# Patient Record
Sex: Female | Born: 1950 | Race: White | Hispanic: No | Marital: Married | Smoking: Never smoker
Health system: Southern US, Community
[De-identification: ages and names within clinical notes are randomized; demographics above are authoritative.]

## PROBLEM LIST (undated history)

## (undated) DIAGNOSIS — K449 Diaphragmatic hernia without obstruction or gangrene: Secondary | ICD-10-CM

## (undated) DIAGNOSIS — Z87820 Personal history of traumatic brain injury: Secondary | ICD-10-CM

## (undated) DIAGNOSIS — K219 Gastro-esophageal reflux disease without esophagitis: Secondary | ICD-10-CM

## (undated) DIAGNOSIS — K603 Anal fistula, unspecified: Secondary | ICD-10-CM

## (undated) DIAGNOSIS — M810 Age-related osteoporosis without current pathological fracture: Secondary | ICD-10-CM

## (undated) HISTORY — PX: TUBAL LIGATION: SHX77

## (undated) HISTORY — DX: Diaphragmatic hernia without obstruction or gangrene: K44.9

## (undated) HISTORY — PX: UPPER GASTROINTESTINAL ENDOSCOPY: SHX188

## (undated) HISTORY — PX: TONSILLECTOMY: SUR1361

## (undated) HISTORY — DX: Age-related osteoporosis without current pathological fracture: M81.0

---

## 1988-06-24 HISTORY — PX: TOTAL ABDOMINAL HYSTERECTOMY W/ BILATERAL SALPINGOOPHORECTOMY: SHX83

## 1993-06-24 HISTORY — PX: OTHER SURGICAL HISTORY: SHX169

## 1997-10-06 ENCOUNTER — Ambulatory Visit (HOSPITAL_COMMUNITY): Admission: RE | Admit: 1997-10-06 | Discharge: 1997-10-06 | Payer: Self-pay | Admitting: Obstetrics and Gynecology

## 2000-02-15 ENCOUNTER — Other Ambulatory Visit: Admission: RE | Admit: 2000-02-15 | Discharge: 2000-02-15 | Payer: Self-pay | Admitting: Obstetrics & Gynecology

## 2001-03-06 ENCOUNTER — Other Ambulatory Visit: Admission: RE | Admit: 2001-03-06 | Discharge: 2001-03-06 | Payer: Self-pay | Admitting: Obstetrics & Gynecology

## 2002-01-15 ENCOUNTER — Ambulatory Visit (HOSPITAL_COMMUNITY): Admission: RE | Admit: 2002-01-15 | Discharge: 2002-01-15 | Payer: Self-pay | Admitting: Gastroenterology

## 2002-01-15 ENCOUNTER — Encounter: Payer: Self-pay | Admitting: Gastroenterology

## 2002-05-14 ENCOUNTER — Other Ambulatory Visit: Admission: RE | Admit: 2002-05-14 | Discharge: 2002-05-14 | Payer: Self-pay | Admitting: Obstetrics & Gynecology

## 2002-05-19 ENCOUNTER — Encounter: Payer: Self-pay | Admitting: General Surgery

## 2002-05-19 ENCOUNTER — Encounter: Payer: Self-pay | Admitting: Emergency Medicine

## 2002-05-19 ENCOUNTER — Encounter: Payer: Self-pay | Admitting: Family Medicine

## 2002-05-19 ENCOUNTER — Encounter: Admission: RE | Admit: 2002-05-19 | Discharge: 2002-05-19 | Payer: Self-pay | Admitting: Family Medicine

## 2002-05-19 ENCOUNTER — Encounter (INDEPENDENT_AMBULATORY_CARE_PROVIDER_SITE_OTHER): Payer: Self-pay | Admitting: Specialist

## 2002-05-19 ENCOUNTER — Observation Stay (HOSPITAL_COMMUNITY): Admission: EM | Admit: 2002-05-19 | Discharge: 2002-05-20 | Payer: Self-pay | Admitting: Emergency Medicine

## 2002-05-19 HISTORY — PX: LAPAROSCOPIC CHOLECYSTECTOMY: SUR755

## 2002-09-18 ENCOUNTER — Encounter: Payer: Self-pay | Admitting: Gastroenterology

## 2004-04-04 ENCOUNTER — Other Ambulatory Visit: Admission: RE | Admit: 2004-04-04 | Discharge: 2004-04-04 | Payer: Self-pay | Admitting: Obstetrics & Gynecology

## 2007-02-20 ENCOUNTER — Ambulatory Visit: Payer: Self-pay | Admitting: Gastroenterology

## 2007-04-10 ENCOUNTER — Ambulatory Visit: Payer: Self-pay | Admitting: Gastroenterology

## 2007-04-10 HISTORY — PX: COLONOSCOPY: SHX174

## 2007-09-18 DIAGNOSIS — J309 Allergic rhinitis, unspecified: Secondary | ICD-10-CM | POA: Insufficient documentation

## 2007-09-18 DIAGNOSIS — Z8719 Personal history of other diseases of the digestive system: Secondary | ICD-10-CM | POA: Insufficient documentation

## 2007-09-18 DIAGNOSIS — K449 Diaphragmatic hernia without obstruction or gangrene: Secondary | ICD-10-CM | POA: Insufficient documentation

## 2007-09-18 DIAGNOSIS — K625 Hemorrhage of anus and rectum: Secondary | ICD-10-CM

## 2007-09-18 DIAGNOSIS — I499 Cardiac arrhythmia, unspecified: Secondary | ICD-10-CM | POA: Insufficient documentation

## 2007-09-18 DIAGNOSIS — K222 Esophageal obstruction: Secondary | ICD-10-CM

## 2007-09-18 DIAGNOSIS — K649 Unspecified hemorrhoids: Secondary | ICD-10-CM | POA: Insufficient documentation

## 2008-01-19 ENCOUNTER — Ambulatory Visit (HOSPITAL_BASED_OUTPATIENT_CLINIC_OR_DEPARTMENT_OTHER): Admission: RE | Admit: 2008-01-19 | Discharge: 2008-01-19 | Payer: Self-pay | Admitting: Orthopedic Surgery

## 2008-01-19 HISTORY — PX: CARPAL TUNNEL RELEASE: SHX101

## 2008-08-26 ENCOUNTER — Encounter: Admission: RE | Admit: 2008-08-26 | Discharge: 2008-08-26 | Payer: Self-pay | Admitting: Obstetrics and Gynecology

## 2010-05-15 ENCOUNTER — Encounter: Admission: RE | Admit: 2010-05-15 | Discharge: 2010-05-15 | Payer: Self-pay | Admitting: "Endocrinology

## 2010-11-06 NOTE — Op Note (Signed)
NAMESHARNAE, Tamara Colon                    ACCOUNT NO.:  [id number]   MEDICAL RECORD NO.:  [id number]          PATIENT TYPE:  AMB   LOCATION:  DSC                          FACILITY:  [facility]   PHYSICIAN:  Cindee Salt, M.D.       DATE OF BIRTH:  12-29-1950   DATE OF PROCEDURE:  DATE OF DISCHARGE:                               OPERATIVE REPORT   PREOPERATIVE DIAGNOSIS:  Carpal tunnel syndrome, right hand.   POSTOPERATIVE DIAGNOSIS:  Carpal tunnel syndrome, right hand.   OPERATION:  Decompression right median nerve.   SURGEON:  Cindee Salt, MD   ASSISTANT:  Ivery Quale, P.A.   ANESTHESIA:  Forearm based IV regional.   ANESTHESIOLOGIST:  W. Autumn Patty, MD.   HISTORY:  The patient is a 60 year old female with a history of carpal  tunnel syndrome.  EMG nerve conduction is positive.  This has not  responded to conservative treatment.  She has elected to undergo  surgical decompression.  Pre, peri, and postoperative course have been  discussed along with the risks and complications.  She is aware that  there is no guarantee with the surgery; possibility of infection,  recurrence; injury to arteries nerves, or tendons; complete relief of  symptoms; and dystrophy.  In the preoperative area, the patient is seen.  The extremity marked by both the patient and surgeon.  Antibiotic given.   PROCEDURE:  The patient was brought to the operating room where forearm  based IV regional anesthetic was carried out under the direction of Dr.  Sampson Goon.  She was prepped using DuraPrep in the supine position with  the right arm free.  A time-out was taken.  Following this, a  longitudinal incision was made in the palm and carried down through the  subcutaneous tissue.  Bleeders were electrocauterized, palmar fascia was  split, superficial palmar arch identified, and the flexor tendon to the  ring little finger identified to the ulnar side of the median nerve.  Carpal retinaculum was incised with sharp  dissection.  Right angle and  Sewall retractor were placed between the skin and the forearm fascia.  The fascia was released for approximately 1.5 centimeter proximal to the  wrist crease under direct vision.  Canal was explored.  No further  lesions were identified.  The wound was irrigated.  Skin closed with  interrupted 5-0 Vicryl Rapide sutures.  The area of compression to the  nerve was apparent with persistent median  artery, which was not thrombosed.  The patient tolerated the procedure  well and was taken to the recovery for observation in satisfactory  condition.  She will be discharged home to return to the hand center in  [place] in 1 week on Vicodin.           ______________________________  Cindee Salt, M.D.     GK/MEDQ  D:  01/19/2008  T:  01/19/2008  Job:  [id number]   cc:   Ernestina Penna, M.D.

## 2010-11-06 NOTE — Assessment & Plan Note (Signed)
[facility]                         GASTROENTEROLOGY OFFICE NOTE   NAME:Tamara Colon, Tamara Colon                           MRN:          [id number]  DATE:02/20/2007                            DOB:          1951-01-15    REFERRING PHYSICIAN:  Crist Fat. Rivard, M.D.   Mrs. Dobie is a 60 year old white female who is referred because of  rectal bleeding.   Mrs. Swor has had rectal bleeding on and off for the last 5 months,  almost on a daily basis, with mild constipation, occasional crampy lower  abdominal pain, occasional rectal discomfort.  She has known prominent  hemorrhoids.  Her last colonoscopy was September of 2004, and was  unremarkable except for external hemorrhoids.  She gives no family  history of colon carcinoma.   The patient's past medical history is remarkable for unexplained cardiac  arrhythmias, she has had a previous cholecystectomy in 2005, an  appendectomy in 1998, with a tubal ligation in 1982.   She is on a variety of multivitamins, but no real medications.   She has a history of SULFA allergy.   FAMILY HISTORY:  Remarkable for bladder cancer in her mother and  prostate cancer in her father.  No known gastrointestinal problems.   SOCIAL HISTORY:  She is married and lives her husband.  She is a  Psychologist, prison and probation services.  She does not smoke or use ethanol.   REVIEW OF SYSTEMS:  Noncontributory except for some chronic fatigue.   LABORATORY DATA:  Data from February 13, 2007 shows a normal CBC, TSH,  metabolic profile.   PHYSICAL EXAMINATION:  She is a healthy-appearing white female in no  distress appearing her stated age.  I cannot appreciate stigmata of chronic liver disease.  She is 5 feet 3-1/2 inches tall and weighs 145 pounds.  Blood pressure  is 102/62 and pulse was 76 and regular.  Her chest was clear to percussion and auscultation.  She was in a regular rhythm without murmurs, gallops, or rubs.  ABDOMINAL EXAM:  Showed no  organomegaly, masses, or tenderness.  Bowel  sounds were normal.  Peripheral extremities were unremarkable.  Mental status was clear.  Inspection of the rectum showed prominent external hemorrhoids, but no  actual fresh bleeding, fissures, or fistula.  RECTAL EXAM:  Showed no masses or tenderness.  Soft stool is guaiac  negative.   ASSESSMENT:  I think Mrs. Folds, most likely, is having recurrent  hemorrhoidal bleeding, probably will require hemorrhoidectomy.   PLAN:  1. Followup colonoscopy exam to complete workup.  2. High-fiber diet with fiber supplements daily.  3. Sitz bath nightly with local 2.5% AnaMantle cream.  4. Consider surgical referral after colonoscopy report.     Vania Rea. Jarold Motto, MD, Caleen Essex, FAGA  Electronically Signed    DRP/MedQ  DD: 02/20/2007  DT: 02/21/2007  Job #: [id number]

## 2010-11-09 NOTE — Op Note (Signed)
NAME:  Tamara Colon, Tamara Colon                              ACCOUNT NO.:  [id number]   MEDICAL RECORD NO.:  [id number]                   PATIENT TYPE:  OBV   LOCATION:  0444                                 FACILITY:  [facility]   PHYSICIAN:  Ollen Gross. Vernell Morgans, M.D.              DATE OF BIRTH:  1951/06/17   DATE OF PROCEDURE:  05/19/2002  DATE OF DISCHARGE:  05/20/2002                                 OPERATIVE REPORT   PREOPERATIVE DIAGNOSIS:  Cholelithiasis.   POSTOPERATIVE DIAGNOSIS:  Cholelithiasis.   PROCEDURE:  Laparoscopic cholecystectomy with intraoperative cholangiogram.   SURGEON:  Ollen Gross. Carolynne Edouard, M.D.   ASSISTANT:  Donnie Coffin. Samuella Cota, M.D.   ANESTHESIA:  General endotracheal.   DESCRIPTION OF PROCEDURE:  After informed consent was obtained, the patient  was brought to the operating room, placed in the supine position on the  operating table. After adequate induction of general anesthesia, the  patient's abdomen was prepped with Betadine and draped in the usual sterile  manner. The area below the umbilicus was infiltrated with 0.25% Marcaine and  a small incision was made with a 15 blade knife. This incision was carried  down through the subcutaneous tissue bluntly using the Kelly clamps and [employer] retractors until the linea alba was identified. The linea alba was then  incised with a 15 blade knife and each side was grasped with Kocher clamps  and elevated anteriorly. The preperitoneal space was then probed bluntly  with a hemostat until the peritoneum was opened and access was gained to the  abdominal cavity. A #0 Vicryl pursestring stitch was then placed in the  fascia surrounding this opening. A Hasson cannula was then placed through  this opening and anchored in place with the previously placed Vicryl  pursestring stitch. The abdomen was then insufflated with carbon dioxide  without difficulty. The patient was placed in a head up position and a  laparoscope was placed through the  Hasson cannula. The dome of the  gallbladder and liver edge were readily identified. Next after infiltrating  the area with 0.25% Marcaine, a small upper midline transverse incision was  made with a 15 blade knife, a 10 mm port was then placed bluntly through  this incision into the abdominal cavity under direct vision. Sites were then  chosen laterally on the right side of the abdomen for placement of 5 mm  ports and each of these areas was infiltrated with 0.25% Marcaine. Small  stab incisions were made with the 15 blade knife and 5 mm ports were placed  bluntly through these incisions into the abdominal cavity under direct  vision. A blunt grasper was placed through the lateral most 5 mm port and  used to grasp the dome of the gallbladder and elevate it anteriorly and  superiorly. Another blunt grasper was placed through the other 5 mm port and  used to  retract on the body and neck of the gallbladder. A dissector was  placed through the upper midline port and using electrocautery, the  peritoneal reflection at the gallbladder neck area was opened, blunt  dissection was then carried out in this area until the gallbladder neck  cystic duct junction was readily identified and dissected in a  circumferential manner. A good window was created and care was taken to keep  the common duct medial to this dissection. Once this had been accomplished,  a clip was placed on the gallbladder neck. A 12 gauge angiocath was placed  percutaneously through the anterior abdominal wall and a Reddick catheter  was placed through the angiocath and flushed. Next, a small ductotomy was  made just below the clip. The Reddick catheter was then placed in the cystic  duct and anchored in place with the clip. A cholangiogram was obtained that  showed no filling defects and good emptying into the duodenum. The anchoring  clip and catheters were then removed from the patient. Three clips were then  placed proximally on  the cystic duct and the duct was divided between the  two sets of clips. Posterior to this, the cystic artery was identified and  again dissected bluntly in a circumferential manner creating a good window.  Two clips were placed proximally and one distally on the artery and the  artery was divided between the two. Next a laparoscopic hook cautery device  was used to separate the gallbladder from the liver bed. Prior to completely  detaching the gallbladder from the liver bed, the liver bed was inspected  and several small bleeding points were coagulated with the electrocautery  until the liver bed was completely hemostatic. The gallbladder was then  detached the rest of the way from the liver bed with the electrocautery. An  endoscopic bag was then placed through the upper midline port, the  gallbladder was placed within the bag and the bag was sealed. The abdomen  was then irrigated with copious amounts of saline until the affluent was  clear. The laparoscope was then placed through the upper midline port and a  gallbladder grasper was placed through the Hasson cannula and used to grasp  the opening of the bag. The bag was then removed through the infraumbilical  port with the gallbladder and Hasson cannula without difficulty. The fascial  defect at the infraumbilical port was then closed with the previously placed  Vicryl pursestring stitch. The rest of the ports were then removed under  direct vision without difficulty and were all hemostatic. The gas was  allowed to escape and the skin incisions were all closed with interrupted 4-  0 Monocryl subcuticular stitches. Benzoin and Steri-Strips were applied. The  patient tolerated the procedure well. At the end of the case, all sponge,  needle and instrument counts were correct. The patient was awakened and  taken to the recovery room in stable condition.                                              Ollen Gross. Vernell Morgans, M.D.    PST/MEDQ   D:  05/26/2002  T:  05/26/2002  Job:  [id number]

## 2011-03-22 LAB — POCT HEMOGLOBIN-HEMACUE: Hemoglobin: 12.4

## 2012-01-23 ENCOUNTER — Other Ambulatory Visit: Payer: Self-pay | Admitting: "Endocrinology

## 2012-01-23 DIAGNOSIS — N6452 Nipple discharge: Secondary | ICD-10-CM

## 2012-01-30 ENCOUNTER — Ambulatory Visit
Admission: RE | Admit: 2012-01-30 | Discharge: 2012-01-30 | Disposition: A | Discharge status: Home / Self Care | Payer: Managed Care, Other (non HMO) | Source: Ambulatory Visit | Attending: "Endocrinology | Admitting: "Endocrinology

## 2012-01-30 DIAGNOSIS — N6452 Nipple discharge: Secondary | ICD-10-CM

## 2013-03-18 ENCOUNTER — Ambulatory Visit: Payer: Self-pay | Admitting: Family Medicine

## 2013-08-30 ENCOUNTER — Emergency Department (HOSPITAL_COMMUNITY)
Admission: EM | Admit: 2013-08-30 | Discharge: 2013-08-30 | Disposition: A | Discharge status: Home / Self Care | Payer: BC Managed Care – PPO | Attending: Emergency Medicine | Admitting: Emergency Medicine

## 2013-08-30 ENCOUNTER — Encounter: Payer: Self-pay | Admitting: Family Medicine

## 2013-08-30 ENCOUNTER — Emergency Department (HOSPITAL_COMMUNITY): Payer: BC Managed Care – PPO

## 2013-08-30 ENCOUNTER — Encounter (INDEPENDENT_AMBULATORY_CARE_PROVIDER_SITE_OTHER): Payer: Self-pay

## 2013-08-30 ENCOUNTER — Encounter (HOSPITAL_COMMUNITY): Payer: Self-pay | Admitting: Emergency Medicine

## 2013-08-30 ENCOUNTER — Ambulatory Visit (INDEPENDENT_AMBULATORY_CARE_PROVIDER_SITE_OTHER): Payer: BC Managed Care – PPO | Admitting: Family Medicine

## 2013-08-30 VITALS — BP 111/72 | HR 125 | Temp 102.1°F | Ht 64.0 in | Wt 164.0 lb

## 2013-08-30 DIAGNOSIS — J020 Streptococcal pharyngitis: Secondary | ICD-10-CM | POA: Insufficient documentation

## 2013-08-30 DIAGNOSIS — J392 Other diseases of pharynx: Secondary | ICD-10-CM

## 2013-08-30 DIAGNOSIS — Z8719 Personal history of other diseases of the digestive system: Secondary | ICD-10-CM | POA: Insufficient documentation

## 2013-08-30 DIAGNOSIS — R509 Fever, unspecified: Secondary | ICD-10-CM

## 2013-08-30 DIAGNOSIS — Z9089 Acquired absence of other organs: Secondary | ICD-10-CM | POA: Insufficient documentation

## 2013-08-30 DIAGNOSIS — J988 Other specified respiratory disorders: Secondary | ICD-10-CM

## 2013-08-30 DIAGNOSIS — R110 Nausea: Secondary | ICD-10-CM | POA: Insufficient documentation

## 2013-08-30 HISTORY — DX: Gastro-esophageal reflux disease without esophagitis: K21.9

## 2013-08-30 LAB — CBC WITH DIFFERENTIAL/PLATELET
BASOS ABS: 0 10*3/uL (ref 0.0–0.1)
Basophils Relative: 0 % (ref 0–1)
EOS PCT: 0 % (ref 0–5)
Eosinophils Absolute: 0 10*3/uL (ref 0.0–0.7)
HCT: 38.7 % (ref 36.0–46.0)
Hemoglobin: 13.5 g/dL (ref 12.0–15.0)
LYMPHS ABS: 0.9 10*3/uL (ref 0.7–4.0)
LYMPHS PCT: 5 % — AB (ref 12–46)
MCH: 31.5 pg (ref 26.0–34.0)
MCHC: 34.9 g/dL (ref 30.0–36.0)
MCV: 90.2 fL (ref 78.0–100.0)
Monocytes Absolute: 0.1 10*3/uL (ref 0.1–1.0)
Monocytes Relative: 1 % — ABNORMAL LOW (ref 3–12)
NEUTROS ABS: 16.9 10*3/uL — AB (ref 1.7–7.7)
NEUTROS PCT: 94 % — AB (ref 43–77)
PLATELETS: 236 10*3/uL (ref 150–400)
RBC: 4.29 MIL/uL (ref 3.87–5.11)
RDW: 13 % (ref 11.5–15.5)
WBC: 18 10*3/uL — AB (ref 4.0–10.5)

## 2013-08-30 LAB — POCT CBC
GRANULOCYTE PERCENT: 93.3 % — AB (ref 37–80)
HCT, POC: 42.3 % (ref 37.7–47.9)
Hemoglobin: 13.7 g/dL (ref 12.2–16.2)
LYMPH, POC: 1.1 (ref 0.6–3.4)
MCH, POC: 29.3 pg (ref 27–31.2)
MCHC: 32.4 g/dL (ref 31.8–35.4)
MCV: 90.7 fL (ref 80–97)
MPV: 7.4 fL (ref 0–99.8)
PLATELET COUNT, POC: 265 10*3/uL (ref 142–424)
POC GRANULOCYTE: 17 — AB (ref 2–6.9)
POC LYMPH %: 5.9 % — AB (ref 10–50)
RBC: 4.7 M/uL (ref 4.04–5.48)
RDW, POC: 13.1 %
WBC: 18.2 10*3/uL — AB (ref 4.6–10.2)

## 2013-08-30 LAB — BASIC METABOLIC PANEL
BUN: 11 mg/dL (ref 6–23)
CALCIUM: 8.5 mg/dL (ref 8.4–10.5)
CO2: 24 meq/L (ref 19–32)
Chloride: 103 mEq/L (ref 96–112)
Creatinine, Ser: 0.52 mg/dL (ref 0.50–1.10)
GFR calc Af Amer: >90 mL/min (ref >90)
GFR calc non Af Amer: >90 mL/min (ref >90)
GLUCOSE: 168 mg/dL — AB (ref 70–99)
POTASSIUM: 4.2 meq/L (ref 3.7–5.3)
SODIUM: 138 meq/L (ref 137–147)

## 2013-08-30 LAB — RAPID STREP SCREEN (MED CTR MEBANE ONLY): STREPTOCOCCUS, GROUP A SCREEN (DIRECT): POSITIVE — AB

## 2013-08-30 MED ORDER — ONDANSETRON HCL 4 MG/2ML IJ SOLN
4.0000 mg | Freq: Once | INTRAMUSCULAR | Status: AC
  Administered 2013-08-30: 4 mg via INTRAVENOUS
  Filled 2013-08-30: qty 2

## 2013-08-30 MED ORDER — PENICILLIN G BENZATHINE 1200000 UNIT/2ML IM SUSP
2.4000 10*6.[IU] | Freq: Once | INTRAMUSCULAR | Status: AC
  Administered 2013-08-30: 2.4 10*6.[IU] via INTRAMUSCULAR
  Filled 2013-08-30: qty 4

## 2013-08-30 MED ORDER — ACETAMINOPHEN 160 MG/5ML PO SOLN
650.0000 mg | Freq: Once | ORAL | Status: AC
  Administered 2013-08-30: 650 mg via ORAL
  Filled 2013-08-30: qty 20.3

## 2013-08-30 MED ORDER — METHYLPREDNISOLONE SODIUM SUCC 125 MG IJ SOLR
125.0000 mg | Freq: Once | INTRAMUSCULAR | Status: AC
  Administered 2013-08-30: 125 mg via INTRAMUSCULAR

## 2013-08-30 MED ORDER — IOHEXOL 300 MG/ML  SOLN
75.0000 mL | Freq: Once | INTRAMUSCULAR | Status: AC | PRN
  Administered 2013-08-30: 75 mL via INTRAVENOUS

## 2013-08-30 MED ORDER — FAMOTIDINE 20 MG PO TABS
20.0000 mg | ORAL_TABLET | Freq: Once | ORAL | Status: AC
  Administered 2013-08-30: 20 mg via ORAL
  Filled 2013-08-30: qty 1

## 2013-08-30 MED ORDER — SODIUM CHLORIDE 0.9 % IV BOLUS (SEPSIS)
1000.0000 mL | Freq: Once | INTRAVENOUS | Status: AC
  Administered 2013-08-30: 1000 mL via INTRAVENOUS

## 2013-08-30 MED ORDER — DEXAMETHASONE 6 MG PO TABS
10.0000 mg | ORAL_TABLET | Freq: Once | ORAL | Status: AC
  Administered 2013-08-30: 10 mg via ORAL
  Filled 2013-08-30: qty 1

## 2013-08-30 NOTE — Progress Notes (Addendum)
   Subjective:    Patient ID: Tamara Colon, female    DOB: Oct 01, 1950, 63 y.o.   MRN: [id number]  HPI Pt presents today with chief complaint of oropharyngeal swelling, fever malaise.  Pt with minimal ability to talk currently Has had fever since yesterday per husbnad with generalized malaise.  Worsening sxs over past 24 hours.  Unable to swallow, breathe through her mouth.  4 episodes of vomiting.  + drooling and trismus.  Has sensation of throat swelling that is predominantly R sided.  Prior hx/o tonsilectomy in the past.     Review of Systems  All other systems reviewed and are negative.       Objective:   Physical Exam  Constitutional: She appears distressed.  HENT:  Right Ear: External ear normal.  Left Ear: External ear normal.  Unable to visualize post oropharynx    Eyes: Conjunctivae are normal. Pupils are equal, round, and reactive to light.  Neck:  + generalized soft tissue swelling    Cardiovascular: Normal rate and regular rhythm.   Pulmonary/Chest:  Mild to moderate increased WOB   Abdominal: Soft.  Musculoskeletal: Normal range of motion.  Neurological: She is alert.  Skin: Skin is warm. She is diaphoretic.          Assessment & Plan:  .Fever - Plan: POCT CBC  Airway compromise  Pharyngeal swelling  DDX includes epiglottitis, peritonsillar abscess, other infectious process. ? angioedema Airway compromised today without complete occlusion.  Supplemental O2 placed. Pt placed on R side with improvement in oxygenation Pt given 125mg  solumedrol IM x1  EMS emergently called.  O2 has remained 96% ( up from 92%) with supplemental O2 and position change. Pt directly observed at bedside until arrival of EMS.

## 2013-08-30 NOTE — ED Notes (Signed)
Patient transported to X-ray 

## 2013-08-30 NOTE — ED Notes (Addendum)
Patient transported to CT 

## 2013-08-30 NOTE — ED Notes (Signed)
lab at bedside to draw blood.

## 2013-08-30 NOTE — ED Notes (Addendum)
Pt from [place], sent for eval of epiglottis. Pt also c/o chill beginging last night. Airway intact. Difficulty swallowing. Pt received 125mg  solumedrol at pcp

## 2013-08-30 NOTE — ED Provider Notes (Signed)
CSN: [id number]     Arrival date & time 08/30/13  1357 History   First MD Initiated Contact with Patient 08/30/13 1359     No chief complaint on file.  HPI Comments: 63 yo F hx of GERD, presents with CC of sore throat, neck swelling.  Pt states symptoms began yesterday.  She c/o of sore throat, right neck swelling, fever high of 102, nausea, slight difficulty swallowing.  Family states they have noticed some voice change, but pt states this is 2/2 phlegm in back of throat.  Denies any other symptoms.  Denies hx of immunosuppression.  Pt went to PCP this AM and there was concern for abscess, vs epiglottitis.  Pt was given solumedrol 125 mg IV, and sent to ED for further evaluation.  No other complaints.    The history is provided by the patient. No language interpreter was used.    No past medical history on file. Past Surgical History  Procedure Laterality Date  . Tonsillectomy    . Cholecystectomy    . Abdominal hysterectomy     Family History  Problem Relation Age of Onset  . Cancer Mother     bladder  . Diabetes Father   . Cancer Father     prostate  . Cancer Brother     prostate   History  Substance Use Topics  . Smoking status: Never Smoker   . Smokeless tobacco: Never Used  . Alcohol Use: No   OB History   Grav Para Term Preterm Abortions TAB SAB Ect Mult Living                 Review of Systems  Constitutional: Positive for fever. Negative for chills.  HENT: Positive for sore throat, trouble swallowing and voice change. Negative for congestion, dental problem, drooling, facial swelling and rhinorrhea.        Right neck swelling   Respiratory: Negative for cough and shortness of breath.   Cardiovascular: Negative for chest pain, palpitations and leg swelling.  Gastrointestinal: Positive for nausea. Negative for vomiting, abdominal pain, diarrhea and constipation.  Musculoskeletal: Positive for neck pain. Negative for myalgias and neck stiffness.  Skin: Negative for  rash.  Neurological: Negative for dizziness, weakness, light-headedness, numbness and headaches.  Hematological: Negative for adenopathy. Does not bruise/bleed easily.  All other systems reviewed and are negative.      Allergies  Sulfa antibiotics  Home Medications  No current outpatient prescriptions on file. BP 130/78  Pulse 112  Temp(Src) 101.1 F (38.4 C) (Oral)  Resp 20  SpO2 96% Physical Exam  Nursing note and vitals reviewed. Constitutional: She is oriented to person, place, and time. She appears well-developed and well-nourished.  HENT:  Head: Normocephalic and atraumatic.  Right Ear: External ear normal.  Left Ear: External ear normal.  Nose: Nose normal.  Mouth/Throat: Oropharyngeal exudate present.  Mild erythema, exudate, on left uvular swelling.  No signs of PTA, Ludwigs, RPA.  No trismus.  No drooling.    Eyes: Conjunctivae and EOM are normal. Pupils are equal, round, and reactive to light.  Neck: Normal range of motion. Neck supple.  Swelling of R SCM, with some palpable lymph node, no erythema, no fluctuance.  Normal ROM of neck.    Cardiovascular: Normal rate, regular rhythm, normal heart sounds and intact distal pulses.   Pulmonary/Chest: Breath sounds normal. No stridor. No respiratory distress. She has no wheezes. She has no rales. She exhibits no tenderness.  Abdominal: Soft. Bowel sounds are  normal. She exhibits no distension and no mass. There is no tenderness. There is no rebound and no guarding.  Musculoskeletal: Normal range of motion.  Lymphadenopathy:    She has cervical adenopathy.  Neurological: She is alert and oriented to person, place, and time.  Skin: Skin is warm and dry.    ED Course  Procedures (including critical care time) Labs Review Labs Reviewed  RAPID STREP SCREEN - Abnormal; Notable for the following:    Streptococcus, Group A Screen (Direct) POSITIVE (*)    All other components within normal limits   Imaging Review Dg  Neck Soft Tissue  08/30/2013   CLINICAL DATA:  Swelling in neck. Evaluate for epiglottitis. Fever.  EXAM: NECK SOFT TISSUES - 1+ VIEW  COMPARISON:  None.  FINDINGS: There is no evidence of retropharyngeal soft tissue swelling.  It is difficult to identify the epiglottis on this radiograph as a discrete thin structure at the base of the tongue. It is possible that the normal epiglottis is obscured behind the hyoid bone, but it is also possible there is mild epiglottic inflammation. CT of the neck with contrast recommended for further evaluation. Normal osseous structures.  IMPRESSION: Difficult to identify the normal epiglottis on this radiograph. No retropharyngeal soft tissue swelling. If concern for acute epiglottitis, CT of the neck with contrast recommended.   Electronically Signed   By: Rolla Flatten M.D.   On: 08/30/2013 15:41     EKG Interpretation None      MDM   Final diagnoses:  None   63 yo F hx of GERD, presents with CC of sore throat, neck swelling.  Filed Vitals:   08/30/13 1345  BP: 130/78  Pulse: 112  Temp: 101.1 F (38.4 C)  Resp: 20   Physical exam as above. Febrile 101.1, tachy 112, satting 96% 2 L.  Pt with some R neck swelling, signs of pharyngitis without signs of PTA, Ludwigs, or RPA.  No stridor on exam, pt protecting airway.  CBC from outside visit positive for leukocytosis to 18.  Discussion made with patient and family about imaging, XR neck vs CT imaging of neck.  They opted to continue with XR neck.  Pt given IV fluids, tylenol, zofran for symptoms.  Rapid strep test ordered, which is positive.  XR neck ordered which states difficult to identify normal epiglottis, without retropharyngeal soft tissue swelling, CT neck recommended.  This was discussed with patient and pt's family, and they agree to go ahead with CT neck to definitively r/o epiglottitis, abscess.  If negative pt may be d/c with PCN, and f/u with PCP.  If positive, pt to receive IV abx, and appropriate  consults made.  Pt signed out to Dr. Judithann Graves while waiting for CT scan, and understands plan for care.    Pt's care discussed with Dr. Reather Converse.   Sinda Du, MD      Sinda Du, MD 08/30/13 [phone]

## 2013-08-30 NOTE — Discharge Instructions (Signed)
Pharyngitis °Pharyngitis is a sore throat (pharynx). There is redness, pain, and swelling of your throat. °HOME CARE  °· Drink enough fluids to keep your pee (urine) clear or pale yellow. °· Only take medicine as told by your doctor. °· You may get sick again if you do not take medicine as told. Finish your medicines, even if you start to feel better. °· Do not take aspirin. °· Rest. °· Rinse your mouth (gargle) with salt water (½ tsp of salt per 1 qt of water) every 1 2 hours. This will help the pain. °· If you are not at risk for choking, you can suck on hard candy or sore throat lozenges. °GET HELP IF: °· You have large, tender lumps on your neck. °· You have a rash. °· You cough up green, yellow-brown, or bloody spit. °GET HELP RIGHT AWAY IF:  °· You have a stiff neck. °· You drool or cannot swallow liquids. °· You throw up (vomit) or are not able to keep medicine or liquids down. °· You have very bad pain that does not go away with medicine. °· You have problems breathing (not from a stuffy nose). °MAKE SURE YOU:  °· Understand these instructions. °· Will watch your condition. °· Will get help right away if you are not doing well or get worse. °Document Released: 11/27/2007 Document Revised: 03/31/2013 Document Reviewed: 02/15/2013 °[employer]® Patient Information ©2014 [employer]. ° °

## 2013-08-30 NOTE — ED Provider Notes (Signed)
Care assumed from Dr. Justin Mend. At sign out awaiting CT scan of neck to r/o epiglottitis vs abscess. Doubt PTA or RPA on exam. If CT negative send home on amoxicillin or offer penicillin IM here to treat strep pharyngitis.    7:08 PM  Patient received dose of IM penicillin and decadron 10 mg. Thorough discussed importance of follow up with PCP in 1-2 days followed by ENT if not improving. Return to ED immediately if worsening or any concerns.  Case co managed with my attending Dr. Steffanie Dunn and Dr. Kathrynn Humble.   CT NECK:  IMPRESSION: Enlargement of the right lingual tonsil measuring 19 x 33 mm. This has solid homogeneous enhancement and is concerning for tumor. Given the clinical symptoms, infection is another consideration. Close ENT follow-up and tissue biopsy suggested if this does not resolve on antibiotics. Mild enlargement of the left lingual tonsil.  Mild cervical adenopathy which could be reactive or related to tumor spread.  Critical Value/emergent results were called by telephone at the time of interpretation on 08/30/2013 at 5:40 PM to Dr. Kathrynn Humble , who verbally acknowledged these results.   Electronically Signed By: Franchot Gallo M.D. On: 08/30/2013 17:44    Results for orders placed during the hospital encounter of 08/30/13  RAPID STREP SCREEN      Result Value Ref Range   Streptococcus, Group A Screen (Direct) POSITIVE (*) NEGATIVE      Results for orders placed during the hospital encounter of 08/30/13  RAPID STREP SCREEN      Result Value Ref Range   Streptococcus, Group A Screen (Direct) POSITIVE (*) NEGATIVE  CBC WITH DIFFERENTIAL      Result Value Ref Range   WBC 18.0 (*) 4.0 - 10.5 K/uL   RBC 4.29  3.87 - 5.11 MIL/uL   Hemoglobin 13.5  12.0 - 15.0 g/dL   HCT 38.7  36.0 - 46.0 %   MCV 90.2  78.0 - 100.0 fL   MCH 31.5  26.0 - 34.0 pg   MCHC 34.9  30.0 - 36.0 g/dL   RDW 13.0  11.5 - 15.5 %   Platelets 236  150 - 400 K/uL   Neutrophils Relative % 94 (*) 43 - 77 %    Neutro Abs 16.9 (*) 1.7 - 7.7 K/uL   Lymphocytes Relative 5 (*) 12 - 46 %   Lymphs Abs 0.9  0.7 - 4.0 K/uL   Monocytes Relative 1 (*) 3 - 12 %   Monocytes Absolute 0.1  0.1 - 1.0 K/uL   Eosinophils Relative 0  0 - 5 %   Eosinophils Absolute 0.0  0.0 - 0.7 K/uL   Basophils Relative 0  0 - 1 %   Basophils Absolute 0.0  0.0 - 0.1 K/uL  BASIC METABOLIC PANEL      Result Value Ref Range   Sodium 138  137 - 147 mEq/L   Potassium 4.2  3.7 - 5.3 mEq/L   Chloride 103  96 - 112 mEq/L   CO2 24  19 - 32 mEq/L   Glucose, Bld 168 (*) 70 - 99 mg/dL   BUN 11  6 - 23 mg/dL   Creatinine, Ser 0.52  0.50 - 1.10 mg/dL   Calcium 8.5  8.4 - 10.5 mg/dL   GFR calc non Af Amer >90  >90 mL/min   GFR calc Af Amer >90  >90 mL/min      1. Strep pharyngitis      Ruthell Rummage, MD 08/30/13 1910

## 2013-08-30 NOTE — ED Provider Notes (Signed)
Medical screening examination/treatment/procedure(s) were conducted as a shared visit with non-physician practitioner(s) or resident and myself. I personally evaluated the patient during the encounter and agree with the findings and plan unless otherwise indicated.  I have personally reviewed any xrays and/ or EKG's with the provider and I agree with interpretation.  Worsening sore throat for 2 days, sent for evaluation from [facility] for possible epiglottitis. Fever and chills. Hx of strep and mono, no current sick contacts. Pt does not feel voice has changed except when mucous builds up. Exam mild posterior pharyngeal edema with mild exudate/ ulcer left posterior, No trismus, uvular deviation, unilateral posterior pharyngeal edema or submandibular swelling. Right anterior cervical adenopathy/ tender, neck supple, full rom. Plan for fluids, pain meds, strep, abx and xray soft tissue. Pending result and pt recheck, discussed may need CT neck.  Xray indeterminant.  CT neck pending.   Acute strep pharyngitis.   Mariea Clonts, MD 08/30/13 [phone]

## 2013-09-02 NOTE — ED Provider Notes (Signed)
Tamara Colon, Tamara Colon I agree with plan after sign out.   Mariea Clonts, MD 09/02/13 [phone]

## 2013-09-11 ENCOUNTER — Encounter: Payer: Self-pay | Admitting: Family Medicine

## 2013-09-11 ENCOUNTER — Ambulatory Visit (INDEPENDENT_AMBULATORY_CARE_PROVIDER_SITE_OTHER): Payer: BC Managed Care – PPO | Admitting: Family Medicine

## 2013-09-11 VITALS — BP 105/69 | HR 90 | Temp 99.2°F | Ht 64.0 in | Wt 161.6 lb

## 2013-09-11 DIAGNOSIS — R509 Fever, unspecified: Secondary | ICD-10-CM

## 2013-09-11 DIAGNOSIS — J029 Acute pharyngitis, unspecified: Secondary | ICD-10-CM

## 2013-09-11 LAB — POCT RAPID STREP A (OFFICE): Rapid Strep A Screen: NEGATIVE

## 2013-09-11 LAB — POCT INFLUENZA A/B
Influenza A, POC: NEGATIVE
Influenza B, POC: NEGATIVE

## 2013-09-11 MED ORDER — CEFDINIR 300 MG PO CAPS: 300.0000 mg | ORAL_CAPSULE | Freq: Two times a day (BID) | ORAL | Status: DC

## 2013-09-11 NOTE — Progress Notes (Signed)
Patient ID: Tamara Colon, female   DOB: 27-Apr-1951, 63 y.o.   MRN: [id number] SUBJECTIVE: CC: Chief Complaint  Patient presents with  . Fever  . Cough  . Sore Throat    HPI: As above. Had severe tonsillar edema and infection from strep requiring ED evaluation and CT of the neck on 08/30/2013. Was treated with 2 penicillin shots. No outpatient antibiotics. Evaluated by ENT and has a follow up.  Past Medical History  Diagnosis Date  . GERD (gastroesophageal reflux disease)   . Closed right hip fracture    Past Surgical History  Procedure Laterality Date  . Tonsillectomy    . Cholecystectomy    . Abdominal hysterectomy    . Right hip sx     History   Social History  . Marital Status: Married    Spouse Name: N/A    Number of Children: N/A  . Years of Education: N/A   Occupational History  . Not on file.   Social History Main Topics  . Smoking status: Never Smoker   . Smokeless tobacco: Never Used  . Alcohol Use: No  . Drug Use: No  . Sexual Activity: Not on file   Other Topics Concern  . Not on file   Social History Narrative  . No narrative on file   Family History  Problem Relation Age of Onset  . Cancer Mother     bladder  . Diabetes Father   . Cancer Father     prostate  . Cancer Brother     prostate   Current Outpatient Prescriptions on File Prior to Visit  Medication Sig Dispense Refill  . Multiple Vitamins-Minerals (MULTIVITAMIN WITH MINERALS) tablet Take 1 tablet by mouth daily.      Marland Kitchen aspirin 81 MG tablet Take 81 mg by mouth daily.      Marland Kitchen ibuprofen (ADVIL,MOTRIN) 200 MG tablet Take 400 mg by mouth every 6 (six) hours as needed for mild pain.        No current facility-administered medications on file prior to visit.   Allergies  Allergen Reactions  . Sulfa Antibiotics     headache  . Tape Rash    There is no immunization history on file for this patient. Prior to Admission medications   Medication Sig Start Date End Date Taking? Authorizing  Provider  Multiple Vitamins-Minerals (MULTIVITAMIN WITH MINERALS) tablet Take 1 tablet by mouth daily.   Yes Historical Provider, MD  aspirin 81 MG tablet Take 81 mg by mouth daily.    Historical Provider, MD  cefdinir (OMNICEF) 300 MG capsule Take 1 capsule (300 mg total) by mouth 2 (two) times daily. 09/11/13   Vernie Shanks, MD  ibuprofen (ADVIL,MOTRIN) 200 MG tablet Take 400 mg by mouth every 6 (six) hours as needed for mild pain.     Historical Provider, MD     ROS: As above in the HPI. All other systems are stable or negative.  OBJECTIVE: APPEARANCE:  Patient in no acute distress.The patient appeared well nourished and normally developed. Acyanotic. Waist: VITAL SIGNS:BP 105/69  Pulse 90  Temp(Src) 99.2 F (37.3 C) (Oral)  Ht 5\' 4"  (1.626 m)  Wt 161 lb 9.6 oz (73.301 kg)  BMI 27.72 kg/m2  WF slightly warm to touch.  SKIN: warm and  Dry without overt rashes, tattoos and scars  HEAD and Neck: without JVD, Head and scalp: normal Eyes:No scleral icterus. Fundi normal, eye movements normal. Ears: Auricle normal, canal normal, Tympanic membranes normal, insufflation  normal. Nose: normal Throat: red. No tonsillar hypertrophy, no exudates. Neck & thyroid: normal. No stridor.  CHEST & LUNGS: Chest wall: normal Lungs: Clear  CVS: Reveals the PMI to be normally located. Regular rhythm, First and Second Heart sounds are normal,  absence of murmurs, rubs or gallops. Peripheral vasculature: Radial pulses: normal Dorsal pedis pulses: normal Posterior pulses: normal  ABDOMEN:  Appearance: normal Benign, no organomegaly, no masses, no Abdominal Aortic enlargement. No Guarding , no rebound. No Bruits. Bowel sounds: normal  RECTAL: N/A GU: N/A  EXTREMETIES: nonedematous.  MUSCULOSKELETAL:  Spine: normal Joints: intact  NEUROLOGIC: oriented to time,place and person; nonfocal.  ASSESSMENT:  Fever, unspecified - Plan: POCT rapid strep A, cefdinir (OMNICEF) 300 MG  capsule  Sore throat - Plan: POCT Influenza A/B, cefdinir (OMNICEF) 300 MG capsule Recent tonsillitis and soft tissue edema.  PLAN:  Orders Placed This Encounter  Procedures  . POCT rapid strep A  . POCT Influenza A/B   Results for orders placed in visit on 09/11/13  POCT RAPID STREP A (OFFICE)      Result Value Ref Range   Rapid Strep A Screen Negative  Negative  POCT INFLUENZA A/B      Result Value Ref Range   Influenza A, POC Negative     Influenza B, POC Negative      Meds ordered this encounter  Medications  . cefdinir (OMNICEF) 300 MG capsule    Sig: Take 1 capsule (300 mg total) by mouth 2 (two) times daily.    Dispense:  20 capsule    Refill:  0   There are no discontinued medications. Return if symptoms worsen or fail to improve.  Harim Bi P. Jacelyn Grip, M.D.

## 2013-09-11 NOTE — Patient Instructions (Signed)
Pharyngitis Pharyngitis is redness, pain, and swelling (inflammation) of your pharynx.  CAUSES  Pharyngitis is usually caused by infection. Most of the time, these infections are from viruses (viral) and are part of a cold. However, sometimes pharyngitis is caused by bacteria (bacterial). Pharyngitis can also be caused by allergies. Viral pharyngitis may be spread from person to person by coughing, sneezing, and personal items or utensils (cups, forks, spoons, toothbrushes). Bacterial pharyngitis may be spread from person to person by more intimate contact, such as kissing.  SIGNS AND SYMPTOMS  Symptoms of pharyngitis include:   Sore throat.   Tiredness (fatigue).   Low-grade fever.   Headache.  Joint pain and muscle aches.  Skin rashes.  Swollen lymph nodes.  Plaque-like film on throat or tonsils (often seen with bacterial pharyngitis). DIAGNOSIS  Your health care provider will ask you questions about your illness and your symptoms. Your medical history, along with a physical exam, is often all that is needed to diagnose pharyngitis. Sometimes, a rapid strep test is done. Other lab tests may also be done, depending on the suspected cause.  TREATMENT  Viral pharyngitis will usually get better in 3 4 days without the use of medicine. Bacterial pharyngitis is treated with medicines that kill germs (antibiotics).  HOME CARE INSTRUCTIONS   Drink enough water and fluids to keep your urine clear or pale yellow.   Only take over-the-counter or prescription medicines as directed by your health care provider:   If you are prescribed antibiotics, make sure you finish them even if you start to feel better.   Do not take aspirin.   Get lots of rest.   Gargle with 8 oz of salt water ( tsp of salt per 1 qt of water) as often as every 1 2 hours to soothe your throat.   Throat lozenges (if you are not at risk for choking) or sprays may be used to soothe your throat. SEEK MEDICAL  CARE IF:   You have large, tender lumps in your neck.  You have a rash.  You cough up green, yellow-brown, or bloody spit. SEEK IMMEDIATE MEDICAL CARE IF:   Your neck becomes stiff.  You drool or are unable to swallow liquids.  You vomit or are unable to keep medicines or liquids down.  You have severe pain that does not go away with the use of recommended medicines.  You have trouble breathing (not caused by a stuffy nose). MAKE SURE YOU:   Understand these instructions.  Will watch your condition.  Will get help right away if you are not doing well or get worse. Document Released: 06/10/2005 Document Revised: 03/31/2013 Document Reviewed: 02/15/2013 [facility] Patient Information 2014 [place].   Tonsillitis Tonsillitis is an infection of the throat that causes the tonsils to become red, tender, and swollen. Tonsils are collections of lymphoid tissue at the back of the throat. Each tonsil has crevices (crypts). Tonsils help fight nose and throat infections and keep infection from spreading to other parts of the body for the first 18 months of life.  CAUSES Sudden (acute) tonsillitis is usually caused by infection with streptococcal bacteria. Long-lasting (chronic) tonsillitis occurs when the crypts of the tonsils become filled with pieces of food and bacteria, which makes it easy for the tonsils to become repeatedly infected. SYMPTOMS  Symptoms of tonsillitis include:  A sore throat, with possible difficulty swallowing.  White patches on the tonsils.  Fever.  Tiredness.  New episodes of snoring during sleep, when you  did not snore before.  Small, foul-smelling, yellowish-white pieces of material (tonsilloliths) that you occasionally cough up or spit out. The tonsilloliths can also cause you to have bad breath. DIAGNOSIS Tonsillitis can be diagnosed through a physical exam. Diagnosis can be confirmed with the results of lab tests, including a throat  culture. TREATMENT  The goals of tonsillitis treatment include the reduction of the severity and duration of symptoms and prevention of associated conditions. Symptoms of tonsillitis can be improved with the use of steroids to reduce the swelling. Tonsillitis caused by bacteria can be treated with antibiotics. Usually, treatment with antibiotics is started before the cause of the tonsillitis is known. However, if it is determined that the cause is not bacterial, antibiotics will not treat the tonsillitis. If attacks of tonsillitis are severe and frequent, your caregiver may recommend surgery to remove the tonsils (tonsillectomy). HOME CARE INSTRUCTIONS   Rest as much as possible and get plenty of sleep.  Drink plenty of fluids. While the throat is very sore, eat soft foods or liquids, such as sherbet, soups, or instant breakfast drinks.  Eat frozen ice pops.  Gargle with a warm or cold liquid to help soothe the throat. Mix 1/4 teaspoon of salt and 1/4 teaspoon of baking soda in in 8 oz of water. SEEK MEDICAL CARE IF:   Large, tender lumps develop in your neck.  A rash develops.  A green, yellow-brown, or bloody substance is coughed up.  You are unable to swallow liquids or food for 24 hours.  You notice that only one of the tonsils is swollen. SEEK IMMEDIATE MEDICAL CARE IF:   You develop any new symptoms such as vomiting, severe headache, stiff neck, chest pain, or trouble breathing or swallowing.  You have severe throat pain along with drooling or voice changes.  You have severe pain, unrelieved with recommended medications.  You are unable to fully open the mouth.  You develop redness, swelling, or severe pain anywhere in the neck.  You have a fever. MAKE SURE YOU:   Understand these instructions.  Will watch your condition.  Will get help right away if you are not doing well or get worse. Document Released: 03/20/2005 Document Revised: 02/10/2013 Document Reviewed:  11/27/2012 [facility] Patient Information 2014 [place], [place].

## 2013-09-13 ENCOUNTER — Telehealth: Payer: Self-pay | Admitting: Family Medicine

## 2013-09-13 NOTE — Telephone Encounter (Signed)
If having pain to swallow and cannot swallow her saliva and liquids. will need to be seen today or to ED. If just fever, we can wait for 3 days ,that is until tomorrow and recheck tomorrow during the day so we can get labs if needed.

## 2013-09-13 NOTE — Telephone Encounter (Signed)
Patient aware. She has no trouble swalling, but she had a problem like this years ago and ENT was the one she had to go toShe wants to know if you will refer her to ENT

## 2013-09-14 NOTE — Telephone Encounter (Signed)
Denies fever at this point. Developed nasal congestion and dry cough. She has children's Mucinex DM and questioned adding Robitussin. Take Mucinex and increase fluid intake. Don't add Robitussin.  Continue antibiotic. If fever returns, symptoms fail to improve over next 24-48 hrs, or worsen at any point she is to follow up with [place]. Patient stated understanding and agreement to plan.

## 2013-09-14 NOTE — Telephone Encounter (Signed)
Is patient better today?

## 2014-01-17 ENCOUNTER — Ambulatory Visit (INDEPENDENT_AMBULATORY_CARE_PROVIDER_SITE_OTHER): Payer: Self-pay | Admitting: Surgery

## 2014-01-17 ENCOUNTER — Ambulatory Visit (INDEPENDENT_AMBULATORY_CARE_PROVIDER_SITE_OTHER): Payer: BC Managed Care – PPO | Admitting: General Surgery

## 2014-01-17 ENCOUNTER — Encounter (INDEPENDENT_AMBULATORY_CARE_PROVIDER_SITE_OTHER): Payer: Self-pay | Admitting: General Surgery

## 2014-01-17 VITALS — BP 126/80 | HR 76 | Temp 98.0°F | Ht 64.0 in | Wt 159.0 lb

## 2014-01-17 DIAGNOSIS — K603 Anal fistula: Secondary | ICD-10-CM

## 2014-01-17 NOTE — Patient Instructions (Signed)

## 2014-01-17 NOTE — Progress Notes (Signed)
Chief Complaint  Patient presents with  . eval anal cyst    HISTORY: Tamara Colon is a 63 y.o. female who presents to the office with a perianal mass that drained in March but has not completely healed.  Other symptoms include itching.   It is intermittent in nature.  her bowel habits are regular and her bowel movements are usually soft.  her fiber intake is dietary.  her last colonoscopy was 2008 and normal per pt.  she denies any fecal leakage or loose stools.  She has no personal or family h/o IBD.  she has had 0 anorectal procedures in the past.    Past Medical History  Diagnosis Date  . GERD (gastroesophageal reflux disease)   . Closed right hip fracture       Past Surgical History  Procedure Laterality Date  . Tonsillectomy    . Cholecystectomy    . Abdominal hysterectomy    . Right hip sx          Current Outpatient Prescriptions  Medication Sig Dispense Refill  . aspirin 81 MG tablet Take 81 mg by mouth daily.      Marland Kitchen ibuprofen (ADVIL,MOTRIN) 200 MG tablet Take 400 mg by mouth every 6 (six) hours as needed for mild pain.       . Multiple Vitamins-Minerals (MULTIVITAMIN WITH MINERALS) tablet Take 1 tablet by mouth daily.       No current facility-administered medications for this visit.      Allergies  Allergen Reactions  . Sulfa Antibiotics     headache  . Tape Rash      Family History  Problem Relation Age of Onset  . Cancer Mother     bladder  . Diabetes Father   . Cancer Father     prostate  . Cancer Brother     prostate    History   Social History  . Marital Status: Married    Spouse Name: N/A    Number of Children: N/A  . Years of Education: N/A   Social History Main Topics  . Smoking status: Never Smoker   . Smokeless tobacco: Never Used  . Alcohol Use: No  . Drug Use: No  . Sexual Activity: None   Other Topics Concern  . None   Social History Narrative  . None      REVIEW OF SYSTEMS - PERTINENT POSITIVES ONLY: Review of Systems -  General ROS: negative for - chills, fever or weight loss Hematological and Lymphatic ROS: negative for - bleeding problems, blood clots or bruising Respiratory ROS: no cough, shortness of breath, or wheezing Cardiovascular ROS: no chest pain or dyspnea on exertion Gastrointestinal ROS: no abdominal pain, change in bowel habits, or black or bloody stools Genito-Urinary ROS: no dysuria, trouble voiding, or hematuria  EXAM: Filed Vitals:   01/17/14 1552  BP: 126/80  Pulse: 76  Temp: 98 F (36.7 C)    General appearance: alert and cooperative Resp: clear to auscultation bilaterally Cardio: regular rate and rhythm GI: normal findings: soft, non-tender  Anal Exam Findings: L anterior external opening with purulent drainage.  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  There are no signs of abscess.  We discussed surgical correction, which would consist of a fistulotomy or a seton placement depending on the depth of the fistula tract.  We discussed common risks of surgery, which  include bleeding, infection, recurrence and a small risk of incontinence with fistulotomy.     Rosario Adie, MD Colon and Rectal Surgery / [facility]      Visit Diagnoses: 1. Perianal fistula     Primary Care Physician: Redge Gainer, MD

## 2014-01-27 ENCOUNTER — Telehealth: Payer: Self-pay | Admitting: Gastroenterology

## 2014-01-27 NOTE — Telephone Encounter (Signed)
Spoke with patient and she prefers Dr. Deatra Ina for her new GI MD.(former Sharlett Iles) She does not have any personal or family history of colon cancer. She is having a surgical repair of a fissure. She is waiting on a date for the surgery. Last colon -04/10/07. She is asking when her next colonoscopy should be scheduled. Please, advise.

## 2014-01-28 NOTE — Telephone Encounter (Signed)
Spoke with patient and gave her recall date. Recall in EPIC.

## 2014-01-28 NOTE — Telephone Encounter (Signed)
2018

## 2014-02-22 ENCOUNTER — Encounter (HOSPITAL_BASED_OUTPATIENT_CLINIC_OR_DEPARTMENT_OTHER): Payer: Self-pay | Admitting: *Deleted

## 2014-02-23 ENCOUNTER — Encounter (HOSPITAL_BASED_OUTPATIENT_CLINIC_OR_DEPARTMENT_OTHER): Payer: Self-pay | Admitting: *Deleted

## 2014-02-23 NOTE — Progress Notes (Signed)
NPO AFTER MN. ARRIVE AT 0730. NEEDS HG.

## 2014-03-01 NOTE — Anesthesia Preprocedure Evaluation (Addendum)
Anesthesia Evaluation  Patient identified by MRN, date of birth, ID band Patient awake    Reviewed: Allergy & Precautions, H&P , NPO status , Patient's Chart, lab work & pertinent test results  Airway Mallampati: II TM Distance: >3 FB Neck ROM: full    Dental no notable dental hx. (+) Teeth Intact, Dental Advisory Given   Pulmonary neg pulmonary ROS,  breath sounds clear to auscultation  Pulmonary exam normal       Cardiovascular Exercise Tolerance: Good + dysrhythmias Rhythm:regular Rate:Normal     Neuro/Psych negative neurological ROS  negative psych ROS   GI/Hepatic negative GI ROS, Neg liver ROS,   Endo/Other  negative endocrine ROS  Renal/GU negative Renal ROS  negative genitourinary   Musculoskeletal   Abdominal   Peds  Hematology negative hematology ROS (+)   Anesthesia Other Findings   Reproductive/Obstetrics negative OB ROS                          Anesthesia Physical Anesthesia Plan  ASA: II  Anesthesia Plan: MAC   Post-op Pain Management:    Induction:   Airway Management Planned: Simple Face Mask  Additional Equipment:   Intra-op Plan:   Post-operative Plan:   Informed Consent: I have reviewed the patients History and Physical, chart, labs and discussed the procedure including the risks, benefits and alternatives for the proposed anesthesia with the patient or authorized representative who has indicated his/her understanding and acceptance.   Dental Advisory Given  Plan Discussed with: CRNA and Surgeon  Anesthesia Plan Comments:        Anesthesia Quick Evaluation

## 2014-03-02 ENCOUNTER — Ambulatory Visit (HOSPITAL_BASED_OUTPATIENT_CLINIC_OR_DEPARTMENT_OTHER)
Admission: RE | Admit: 2014-03-02 | Discharge: 2014-03-02 | Disposition: A | Discharge status: Home / Self Care | Payer: BC Managed Care – PPO | Source: Ambulatory Visit | Attending: General Surgery | Admitting: General Surgery

## 2014-03-02 ENCOUNTER — Encounter (HOSPITAL_BASED_OUTPATIENT_CLINIC_OR_DEPARTMENT_OTHER): Payer: Self-pay | Admitting: *Deleted

## 2014-03-02 ENCOUNTER — Encounter (HOSPITAL_BASED_OUTPATIENT_CLINIC_OR_DEPARTMENT_OTHER)
Admission: RE | Disposition: A | Discharge status: Home / Self Care | Payer: Self-pay | Source: Ambulatory Visit | Attending: General Surgery

## 2014-03-02 ENCOUNTER — Encounter (HOSPITAL_BASED_OUTPATIENT_CLINIC_OR_DEPARTMENT_OTHER): Payer: BC Managed Care – PPO | Admitting: Anesthesiology

## 2014-03-02 ENCOUNTER — Ambulatory Visit (HOSPITAL_BASED_OUTPATIENT_CLINIC_OR_DEPARTMENT_OTHER): Payer: BC Managed Care – PPO | Admitting: Anesthesiology

## 2014-03-02 DIAGNOSIS — K603 Anal fistula, unspecified: Secondary | ICD-10-CM | POA: Insufficient documentation

## 2014-03-02 DIAGNOSIS — Z7982 Long term (current) use of aspirin: Secondary | ICD-10-CM | POA: Insufficient documentation

## 2014-03-02 DIAGNOSIS — Z79899 Other long term (current) drug therapy: Secondary | ICD-10-CM | POA: Diagnosis not present

## 2014-03-02 DIAGNOSIS — K644 Residual hemorrhoidal skin tags: Secondary | ICD-10-CM | POA: Diagnosis not present

## 2014-03-02 DIAGNOSIS — K648 Other hemorrhoids: Secondary | ICD-10-CM | POA: Insufficient documentation

## 2014-03-02 DIAGNOSIS — K219 Gastro-esophageal reflux disease without esophagitis: Secondary | ICD-10-CM | POA: Diagnosis not present

## 2014-03-02 HISTORY — DX: Anal fistula, unspecified: K60.30

## 2014-03-02 HISTORY — DX: Personal history of traumatic brain injury: Z87.820

## 2014-03-02 HISTORY — PX: ANAL FISTULOTOMY: SHX6423

## 2014-03-02 HISTORY — DX: Anal fistula: K60.3

## 2014-03-02 LAB — POCT HEMOGLOBIN-HEMACUE: HEMOGLOBIN: 13.6 g/dL (ref 12.0–15.0)

## 2014-03-02 SURGERY — ANAL FISTULOTOMY
Anesthesia: Monitor Anesthesia Care | Site: Rectum

## 2014-03-02 MED ORDER — LACTATED RINGERS IV SOLN
INTRAVENOUS | Status: DC
  Filled 2014-03-02: qty 1000

## 2014-03-02 MED ORDER — MIDAZOLAM HCL 2 MG/2ML IJ SOLN
INTRAMUSCULAR | Status: AC
  Filled 2014-03-02: qty 2

## 2014-03-02 MED ORDER — KETOROLAC TROMETHAMINE 30 MG/ML IJ SOLN
INTRAMUSCULAR | Status: DC | PRN
  Administered 2014-03-02: 30 mg via INTRAVENOUS

## 2014-03-02 MED ORDER — DEXAMETHASONE SODIUM PHOSPHATE 4 MG/ML IJ SOLN
INTRAMUSCULAR | Status: DC | PRN
  Administered 2014-03-02: 10 mg via INTRAVENOUS

## 2014-03-02 MED ORDER — ACETAMINOPHEN 10 MG/ML IV SOLN
INTRAVENOUS | Status: DC | PRN
  Administered 2014-03-02: 1000 mg via INTRAVENOUS

## 2014-03-02 MED ORDER — MIDAZOLAM HCL 5 MG/5ML IJ SOLN
INTRAMUSCULAR | Status: DC | PRN
  Administered 2014-03-02 (×2): 2 mg via INTRAVENOUS

## 2014-03-02 MED ORDER — OXYCODONE HCL 5 MG PO TABS: 5.0000 mg | ORAL_TABLET | Freq: Four times a day (QID) | ORAL | Status: DC | PRN

## 2014-03-02 MED ORDER — PROPOFOL 10 MG/ML IV EMUL
INTRAVENOUS | Status: DC | PRN
  Administered 2014-03-02: 50 ug/kg/min via INTRAVENOUS

## 2014-03-02 MED ORDER — FENTANYL CITRATE 0.05 MG/ML IJ SOLN
INTRAMUSCULAR | Status: AC
  Filled 2014-03-02: qty 2

## 2014-03-02 MED ORDER — LACTATED RINGERS IV SOLN
INTRAVENOUS | Status: DC
  Administered 2014-03-02: 08:00:00 via INTRAVENOUS
  Filled 2014-03-02: qty 1000

## 2014-03-02 MED ORDER — FENTANYL CITRATE 0.05 MG/ML IJ SOLN
INTRAMUSCULAR | Status: DC | PRN
  Administered 2014-03-02: 50 ug via INTRAVENOUS

## 2014-03-02 MED ORDER — FENTANYL CITRATE 0.05 MG/ML IJ SOLN
25.0000 ug | INTRAMUSCULAR | Status: DC | PRN
  Filled 2014-03-02: qty 1

## 2014-03-02 MED ORDER — BUPIVACAINE-EPINEPHRINE 0.5% -1:200000 IJ SOLN
INTRAMUSCULAR | Status: DC | PRN
  Administered 2014-03-02: 25 mL

## 2014-03-02 MED ORDER — LIDOCAINE 5 % EX OINT
TOPICAL_OINTMENT | CUTANEOUS | Status: DC | PRN
  Administered 2014-03-02: 1

## 2014-03-02 SURGICAL SUPPLY — 56 items
BLADE HEX COATED 2.75 (ELECTRODE) ×3 IMPLANT
BLADE SURG 15 STRL LF DISP TIS (BLADE) ×1 IMPLANT
BLADE SURG 15 STRL SS (BLADE) ×3
BRIEF STRETCH FOR OB PAD LRG (UNDERPADS AND DIAPERS) ×6 IMPLANT
CANISTER SUC 1200CC W/6FT TBG (MISCELLANEOUS) ×2 IMPLANT
CANISTER SUCTION 2500CC (MISCELLANEOUS) ×1 IMPLANT
CLOTH BEACON ORANGE TIMEOUT ST (SAFETY) ×3 IMPLANT
COVER MAYO STAND STRL (DRAPES) ×3 IMPLANT
COVER TABLE BACK 60X90 (DRAPES) ×3 IMPLANT
DECANTER SPIKE VIAL GLASS SM (MISCELLANEOUS) ×1 IMPLANT
DRAPE LG THREE QUARTER DISP (DRAPES) IMPLANT
DRAPE PED LAPAROTOMY (DRAPES) ×3 IMPLANT
DRAPE UNDERBUTTOCKS STRL (DRAPE) ×2 IMPLANT
DRAPE UTILITY XL STRL (DRAPES) ×3 IMPLANT
ELECT REM PT RETURN 9FT ADLT (ELECTROSURGICAL) ×3
ELECTRODE REM PT RTRN 9FT ADLT (ELECTROSURGICAL) ×1 IMPLANT
GAUZE SPONGE 4X4 16PLY XRAY LF (GAUZE/BANDAGES/DRESSINGS) IMPLANT
GAUZE VASELINE 3X9 (GAUZE/BANDAGES/DRESSINGS) IMPLANT
GLOVE BIO SURGEON STRL SZ 6.5 (GLOVE) ×4 IMPLANT
GLOVE BIO SURGEONS STRL SZ 6.5 (GLOVE) ×2
GLOVE BIOGEL PI IND STRL 7.0 (GLOVE) ×2 IMPLANT
GLOVE BIOGEL PI INDICATOR 7.0 (GLOVE) ×4
GLOVE INDICATOR 6.5 STRL GRN (GLOVE) ×2 IMPLANT
GLOVE SKINSENSE NS SZ6.5 (GLOVE) ×2
GLOVE SKINSENSE STRL SZ6.5 (GLOVE) IMPLANT
GOWN PREVENTION PLUS LG XLONG (DISPOSABLE) ×1 IMPLANT
GOWN PREVENTION PLUS XLARGE (GOWN DISPOSABLE) ×3 IMPLANT
GOWN STRL REIN XL XLG (GOWN DISPOSABLE) ×1 IMPLANT
GOWN STRL REUS W/TWL 2XL LVL3 (GOWN DISPOSABLE) ×2 IMPLANT
GOWN STRL REUS W/TWL LRG LVL3 (GOWN DISPOSABLE) ×2 IMPLANT
HYDROGEN PEROXIDE 16OZ (MISCELLANEOUS) IMPLANT
LEGGING LITHOTOMY PAIR STRL (DRAPES) IMPLANT
LOOP VESSEL MAXI BLUE (MISCELLANEOUS) IMPLANT
NDL HYPO 25X1 1.5 SAFETY (NEEDLE) ×1 IMPLANT
NDL SAFETY ECLIPSE 18X1.5 (NEEDLE) IMPLANT
NEEDLE HYPO 18GX1.5 SHARP (NEEDLE)
NEEDLE HYPO 25X1 1.5 SAFETY (NEEDLE) ×3 IMPLANT
NS IRRIG 500ML POUR BTL (IV SOLUTION) ×3 IMPLANT
PACK BASIN DAY SURGERY FS (CUSTOM PROCEDURE TRAY) ×3 IMPLANT
PAD ABD 8X10 STRL (GAUZE/BANDAGES/DRESSINGS) ×2 IMPLANT
PENCIL BUTTON HOLSTER BLD 10FT (ELECTRODE) ×3 IMPLANT
SPONGE GAUZE 4X4 12PLY (GAUZE/BANDAGES/DRESSINGS) IMPLANT
SPONGE SURGIFOAM ABS GEL 12-7 (HEMOSTASIS) IMPLANT
SUT CHROMIC 2 0 SH (SUTURE) ×2 IMPLANT
SUT CHROMIC 3 0 SH 27 (SUTURE) ×2 IMPLANT
SUT ETHIBOND 0 (SUTURE) IMPLANT
SUT MON AB 3-0 SH 27 (SUTURE)
SUT MON AB 3-0 SH27 (SUTURE) IMPLANT
SUT VIC AB 4-0 P-3 18XBRD (SUTURE) IMPLANT
SUT VIC AB 4-0 P3 18 (SUTURE)
SYR CONTROL 10ML LL (SYRINGE) ×3 IMPLANT
TOWEL OR 17X24 6PK STRL BLUE (TOWEL DISPOSABLE) ×3 IMPLANT
TRAY DSU PREP LF (CUSTOM PROCEDURE TRAY) ×3 IMPLANT
TUBE CONNECTING 12'X1/4 (SUCTIONS) ×1
TUBE CONNECTING 12X1/4 (SUCTIONS) ×2 IMPLANT
YANKAUER SUCT BULB TIP NO VENT (SUCTIONS) ×3 IMPLANT

## 2014-03-02 NOTE — Transfer of Care (Signed)
Immediate Anesthesia Transfer of Care Note  Patient: Tamara Colon  Procedure(s) Performed: Procedure(s): EXAM UNDER ANESTHESIA,FISTULOTOMY. (N/A)  Patient Location: PACU  Anesthesia Type:MAC  Level of Consciousness: sedated and responds to stimulation  Airway & Oxygen Therapy: Patient Spontanous Breathing and Patient connected to face mask oxygen  Post-op Assessment: Report given to PACU RN  Post vital signs: Reviewed and stable  Complications: No apparent anesthesia complications

## 2014-03-02 NOTE — H&P (Signed)
HISTORY: Tamara Colon is a 63 y.o. female who presents to the office with a perianal mass that drained in March but has not completely healed. Other symptoms include itching. It is intermittent in nature. her bowel habits are regular and her bowel movements are usually soft. her fiber intake is dietary. her last colonoscopy was 2008 and normal per pt. she denies any fecal leakage or loose stools. She has no personal or family h/o IBD. she has had 0 anorectal procedures in the past.  Past Medical History   Diagnosis  Date   .  GERD (gastroesophageal reflux disease)    .  Closed right hip fracture     Past Surgical History   Procedure  Laterality  Date   .  Tonsillectomy     .  Cholecystectomy     .  Abdominal hysterectomy     .  Right hip sx      Current Outpatient Prescriptions   Medication  Sig  Dispense  Refill   .  aspirin 81 MG tablet  Take 81 mg by mouth daily.     Marland Kitchen  ibuprofen (ADVIL,MOTRIN) 200 MG tablet  Take 400 mg by mouth every 6 (six) hours as needed for mild pain.     .  Multiple Vitamins-Minerals (MULTIVITAMIN WITH MINERALS) tablet  Take 1 tablet by mouth daily.      No current facility-administered medications for this visit.    Allergies   Allergen  Reactions   .  Sulfa Antibiotics      headache   .  Tape  Rash    Family History   Problem  Relation  Age of Onset   .  Cancer  Mother      bladder   .  Diabetes  Father    .  Cancer  Father      prostate   .  Cancer  Brother      prostate    History    Social History   .  Marital Status:  Married     Spouse Name:  N/A     Number of Children:  N/A   .  Years of Education:  N/A    Social History Main Topics   .  Smoking status:  Never Smoker   .  Smokeless tobacco:  Never Used   .  Alcohol Use:  No   .  Drug Use:  No   .  Sexual Activity:  None    Other Topics  Concern   .  None    Social History Narrative   .  None   REVIEW OF SYSTEMS - PERTINENT POSITIVES ONLY:  Review of Systems - General ROS:  negative for - chills, fever or weight loss  Hematological and Lymphatic ROS: negative for - bleeding problems, blood clots or bruising  Respiratory ROS: no cough, shortness of breath, or wheezing  Cardiovascular ROS: no chest pain or dyspnea on exertion  Gastrointestinal ROS: no abdominal pain, change in bowel habits, or black or bloody stools  Genito-Urinary ROS: no dysuria, trouble voiding, or hematuria  EXAM:  Filed Vitals:   03/02/14 0748  BP: 113/62  Pulse: 68  Temp: 97.8 F (36.6 C)  Resp: 14   General appearance: alert and cooperative  Resp: clear to auscultation bilaterally  Cardio: regular rate and rhythm  GI: normal findings: soft, non-tender  Anal Exam Findings: L anterior external opening with purulent drainage. No internal opening palpated, no fluctuant masses  ASSESSMENT AND PLAN:  Myrian Tamara Colon is a 63 y.o. F with a perianal fistula. There are no signs of abscess. We discussed surgical correction, which would consist of a fistulotomy or a seton placement depending on the depth of the fistula tract. We discussed common risks of surgery, which include bleeding, infection, recurrence and a small risk of incontinence with fistulotomy.  Tamara Adie, MD  Colon and Rectal Surgery / [facility]

## 2014-03-02 NOTE — Anesthesia Postprocedure Evaluation (Signed)
  Anesthesia Post-op Note  Patient: Tamara Colon  Procedure(s) Performed: Procedure(s) (LRB): EXAM UNDER ANESTHESIA,FISTULOTOMY. (N/A)  Patient Location: PACU  Anesthesia Type: MAC  Level of Consciousness: awake and alert   Airway and Oxygen Therapy: Patient Spontanous Breathing  Post-op Pain: mild  Post-op Assessment: Post-op Vital signs reviewed, Patient's Cardiovascular Status Stable, Respiratory Function Stable, Patent Airway and No signs of Nausea or vomiting  Last Vitals:  Filed Vitals:   03/02/14 1012  BP: 99/55  Pulse: 71  Temp:   Resp: 12    Post-op Vital Signs: stable   Complications: No apparent anesthesia complications

## 2014-03-02 NOTE — Op Note (Signed)
03/02/2014  9:20 AM  PATIENT:  Tamara Colon  63 y.o. female  Patient Care Team: Chipper Herb, MD as PCP - General (Family Medicine)  PRE-OPERATIVE DIAGNOSIS:  Perianal Fistula  POST-OPERATIVE DIAGNOSIS:  Perianal Fistula  PROCEDURE:  Procedure(s): EXAM UNDER ANESTHESIA, FISTULOTOMY.  SURGEON:  Surgeon(s): Leighton Ruff, MD  ASSISTANT: none   ANESTHESIA:   local and MAC  SPECIMEN:  No Specimen  DISPOSITION OF SPECIMEN:  N/A  COUNTS:  YES  PLAN OF CARE: Discharge to home after PACU  PATIENT DISPOSITION:  PACU - hemodynamically stable.  INDICATION: This is a 63 y.o. F with classic symptoms of a perianal fistula.  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  MAC anesthesia was induced without difficulty. The patient was then positioned in prone jackknife position with buttocks gently taped apart.  The patient was then prepped and draped in usual sterile fashion.  SCDs were noted to be in place prior to the initiation of anesthesia. A surgical timeout was performed indicating the correct patient, procedure, positioning and need for preoperative antibiotics.  A rectal block was placed using Marcaine with epinephrine.  I began with a digital rectal exam.   I then placed a Hill-Ferguson anoscope into the anal canal and evaluated this completely.  There were some grade 1-2 internal hemorrhoids.  Externally, there were several skin tags noted.  There was an external opening identified in the L anterior position.  An S shaped fistula probe was placed.  This exited at an internal opening in a radial fashion.  The tract was opened using electrocautery.  There was a small amount of external sphincter involvement noted.  A fistulotomy was performed.  The edges were then  marsupialized using a 2-0 Chromic suture.  Hemostasis was good.  A dressing was applied.  The patient was awakened from anesthesia and sent to the PACU in stable condition.  All counts were correct per OR staff.

## 2014-03-02 NOTE — Discharge Instructions (Addendum)
ANORECTAL SURGERY: POST OP INSTRUCTIONS °1. Take your usually prescribed home medications unless otherwise directed. °2. DIET: During the first few hours after surgery sip on some liquids until you are able to urinate.  It is normal to not urinate for several hours after this surgery.  If you feel uncomfortable, please contact the office for instructions.  After you are able to urinate,you may eat, if you feel like it.  Follow a light bland diet the first 24 hours after arrival home, such as soup, liquids, crackers, etc.  Be sure to include lots of fluids daily (6-8 glasses).  Avoid fast food or heavy meals, as your are more likely to get nauseated.  Eat a low fat diet the next few days after surgery.  Limit caffeine intake to 1-2 servings a day. °3. PAIN CONTROL: °a. Pain is best controlled by a usual combination of several different methods TOGETHER: °i. Muscle relaxation °1.  Soak in a warm bath (or Sitz bath) three times a day and after bowel movements.  Continue to do this until all pain is resolved. °ii. Over the counter pain medication °iii. Prescription pain medication °b. Most patients will experience some swelling and discomfort in the anus/rectal area and incisions.  Heat such as warm towels, sitz baths, warm baths, etc to help relax tight/sore spots and speed recovery.  Some people prefer to use ice, especially in the first couple days after surgery, as it may decrease the pain and swelling, or alternate between ice & heat.  Experiment to what works for you.  Swelling and bruising can take several weeks to resolve.  Pain can take even longer to completely resolve. °c. It is helpful to take an over-the-counter pain medication regularly for the first few weeks.  Choose one of the following that works best for you: °i. Naproxen (Aleve, etc)  Two 220mg tabs twice a day °ii. Ibuprofen (Advil, etc) Three 200mg tabs four times a day (every meal & bedtime) °d. A  prescription for pain medication (such as  percocet, oxycodone, hydrocodone, etc) should be given to you upon discharge.  Take your pain medication as prescribed.  °i. If you are having problems/concerns with the prescription medicine (does not control pain, nausea, vomiting, rash, itching, etc), please call us [phone] to see if we need to switch you to a different pain medicine that will work better for you and/or control your side effect better. °ii. If you need a refill on your pain medication, please contact your pharmacy.  They will contact our office to request authorization. Prescriptions will not be filled after 5 pm or on week-ends. °4. KEEP YOUR BOWELS REGULAR and AVOID CONSTIPATION °a. The goal is one to two soft bowel movements a day.  You should at least have a bowel movement every other day. °b. Avoid getting constipated.  Between the surgery and the pain medications, it is common to experience some constipation. This can be very painful after rectal surgery.  Increasing fluid intake and taking a fiber supplement (such as Metamucil, Citrucel, FiberCon, etc) 1-2 times a day regularly will usually help prevent this problem from occurring.  A stool softener like colace is also recommended.  This can be purchased over the counter at your pharmacy.  You can take it up to 3 times a day.  If you do not have a bowel movement after 24 hrs since your surgery, take one does of milk of magnesia.  If you still haven't had a bowel movement 8-12   hours after that dose, take another dose.  If you don't have a bowel movement 48 hrs after surgery, purchase a Fleets enema from the drug store and administer gently per package instructions.  If you still are having trouble with your bowel movements after that, please call the office for further instructions. °c. If you develop diarrhea or have many loose bowel movements, simplify your diet to bland foods & liquids for a few days.  Stop any stool softeners and decrease your fiber supplement.  Switching to mild  anti-diarrheal medications (Kayopectate, Pepto Bismol) can help.  If this worsens or does not improve, please call us. ° °5. Wound Care °a. Remove your bandages before your first bowel movement or 8 hours after surgery.     °b. Remove any wound packing material at this tim,e as well.  You do not need to repack the wound unless instructed otherwise.  Wear an absorbent pad or soft cotton gauze in your underwear to catch any drainage and help keep the area clean. You should change this every 2-3 hours while awake. °c. Keep the area clean and dry.  Bathe / shower every day, especially after bowel movements.  Keep the area clean by showering / bathing over the incision / wound.   It is okay to soak an open wound to help wash it.  Wet wipes or showers / gentle washing after bowel movements is often less traumatic than regular toilet paper. °d. You may have some styrofoam-like soft packing in the rectum which will come out with the first bowel movement.  °e. You will often notice bleeding with bowel movements.  This should slow down by the end of the first week of surgery °f. Expect some drainage.  This should slow down, too, by the end of the first week of surgery.  Wear an absorbent pad or soft cotton gauze in your underwear until the drainage stops. °g. Do Not sit on a rubber or pillow ring.  This can make you symptoms worse.  You may sit on a soft pillow if needed.  °6. ACTIVITIES as tolerated:   °a. You may resume regular (light) daily activities beginning the next day--such as daily self-care, walking, climbing stairs--gradually increasing activities as tolerated.  If you can walk 30 minutes without difficulty, it is safe to try more intense activity such as jogging, treadmill, bicycling, low-impact aerobics, swimming, etc. °b. Save the most intensive and strenuous activity for last such as sit-ups, heavy lifting, contact sports, etc  Refrain from any heavy lifting or straining until you are off narcotics for pain  control.   °c. You may drive when you are no longer taking prescription pain medication, you can comfortably sit for long periods of time, and you can safely maneuver your car and apply brakes. °d. You may have sexual intercourse when it is comfortable.  °7. FOLLOW UP in our office °a. Please call [facility] at [phone] to set up an appointment to see your surgeon in the office for a follow-up appointment approximately 3-4 weeks after your surgery. °b. Make sure that you call for this appointment the day you arrive home to insure a convenient appointment time. °10. IF YOU HAVE DISABILITY OR FAMILY LEAVE FORMS, BRING THEM TO THE OFFICE FOR PROCESSING.  DO NOT GIVE THEM TO YOUR DOCTOR. ° ° ° ° °WHEN TO CALL US [phone]: °1. Poor pain control °2. Reactions / problems with new medications (rash/itching, nausea, etc)  °3. Fever over 101.5 F (38.5   C) °4. Inability to urinate °5. Nausea and/or vomiting °6. Worsening swelling or bruising °7. Continued bleeding from incision. °8. Increased pain, redness, or drainage from the incision ° °The clinic staff is available to answer your questions during regular business hours (8:30am-5pm).  Please don’t hesitate to call and ask to speak to one of our nurses for clinical concerns.   A surgeon from [facility] is always on call at the hospitals °  °If you have a medical emergency, go to the nearest emergency room or call 911. °  ° °[facility] °[address], [address], [place],   [address] ? °MAIN: [phone] ? TOLL FREE: [phone] ? °FAX [phone] °[email or web address] ° ° ° °Post Anesthesia Home Care Instructions ° °Activity: °Get plenty of rest for the remainder of the day. A responsible adult should stay with you for 24 hours following the procedure.  °For the next 24 hours, DO NOT: °-Drive a car °-Operate machinery °-Drink alcoholic beverages °-Take any medication unless instructed by your physician °-Make  any legal decisions or sign important papers. ° °Meals: °Start with liquid foods such as gelatin or soup. Progress to regular foods as tolerated. Avoid greasy, spicy, heavy foods. If nausea and/or vomiting occur, drink only clear liquids until the nausea and/or vomiting subsides. Call your physician if vomiting continues. ° °Special Instructions/Symptoms: °Your throat may feel dry or sore from the anesthesia or the breathing tube placed in your throat during surgery. If this causes discomfort, gargle with warm salt water. The discomfort should disappear within 24 hours. ° °

## 2014-03-07 ENCOUNTER — Encounter (HOSPITAL_BASED_OUTPATIENT_CLINIC_OR_DEPARTMENT_OTHER): Payer: Self-pay | Admitting: General Surgery

## 2015-06-09 ENCOUNTER — Ambulatory Visit (INDEPENDENT_AMBULATORY_CARE_PROVIDER_SITE_OTHER): Payer: BLUE CROSS/BLUE SHIELD | Admitting: Family Medicine

## 2015-06-09 ENCOUNTER — Encounter: Payer: Self-pay | Admitting: Family Medicine

## 2015-06-09 VITALS — BP 120/73 | HR 58 | Temp 97.5°F | Ht 64.0 in | Wt 168.0 lb

## 2015-06-09 DIAGNOSIS — J020 Streptococcal pharyngitis: Secondary | ICD-10-CM | POA: Diagnosis not present

## 2015-06-09 LAB — POCT RAPID STREP A (OFFICE): Rapid Strep A Screen: POSITIVE — AB

## 2015-06-09 MED ORDER — AMOXICILLIN 500 MG PO CAPS: 500.0000 mg | ORAL_CAPSULE | Freq: Three times a day (TID) | ORAL | Status: DC

## 2015-06-09 NOTE — Patient Instructions (Signed)
Take antibiotic as directed Drink plenty of fluids Take tylenol for aches, pains and fever

## 2015-06-09 NOTE — Progress Notes (Signed)
Subjective:    Patient ID: Tamara Colon, female    DOB: 01/17/1951, 64 y.o.   MRN: [id number]  HPI  Patient here today for sore throat and clear drainage. She states that symptoms started Monday. The throat has felt swollen. She has had a low-grade fever. She's been in the hospital recently because of her daughter being there and staying with her.      Patient Active Problem List   Diagnosis Date Noted  . CARDIAC ARRHYTHMIA 09/18/2007  . HEMORRHOIDS 09/18/2007  . 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 ESOPHAGEAL STRICTURE 09/18/2007  . HIATAL HERNIA 09/18/2007  . RECTAL BLEEDING 09/18/2007  . CHOLELITHIASIS, HX OF 09/18/2007   Outpatient Encounter Prescriptions as of 06/09/2015  Medication Sig  . aspirin 81 MG tablet Take 81 mg by mouth daily.  Marland Kitchen ibuprofen (ADVIL,MOTRIN) 200 MG tablet Take 400 mg by mouth every 6 (six) hours as needed for mild pain.   . Multiple Vitamins-Minerals (MULTIVITAMIN WITH MINERALS) tablet Take 1 tablet by mouth daily.  . [DISCONTINUED] oxyCODONE (OXY IR/ROXICODONE) 5 MG immediate release tablet Take 1-2 tablets (5-10 mg total) by mouth every 6 (six) hours as needed for severe pain.  . calcium citrate-vitamin D (CITRACAL+D) 315-200 MG-UNIT tablet Take by mouth.   No facility-administered encounter medications on file as of 06/09/2015.     Review of Systems  Constitutional: Positive for fever.  HENT: Positive for postnasal drip (clear) and sore throat.   Eyes: Negative.   Respiratory: Negative.   Cardiovascular: Negative.   Gastrointestinal: Negative.   Endocrine: Negative.   Genitourinary: Negative.   Musculoskeletal: Negative.   Skin: Negative.   Allergic/Immunologic: Negative.   Neurological: Negative.   Hematological: Negative.   Psychiatric/Behavioral: Negative.        Objective:   Physical Exam  Constitutional: She is oriented to person, place, and time. She appears well-developed and well-nourished. She appears distressed.  HENT:    Head: Normocephalic and atraumatic.  Right Ear: External ear normal.  Left Ear: External ear normal.  The throat is red posteriorly and the tonsillar area is swollen bilaterally.  Eyes: Conjunctivae and EOM are normal. Pupils are equal, round, and reactive to light. Right eye exhibits no discharge. Left eye exhibits no discharge. No scleral icterus.  Neck: Normal range of motion. Neck supple. No thyromegaly present.  Anterior cervical adenopathy  Cardiovascular: Normal rate, regular rhythm and normal heart sounds.   No murmur heard. Pulmonary/Chest: Effort normal and breath sounds normal. No respiratory distress. She has no wheezes. She has no rales. She exhibits no tenderness.  Clear anteriorly and posteriorly  Musculoskeletal: Normal range of motion. She exhibits no edema.  Lymphadenopathy:    She has no cervical adenopathy.  Neurological: She is alert and oriented to person, place, and time.  Skin: Skin is warm and dry. No rash noted.  Psychiatric: She has a normal mood and affect. Her behavior is normal. Judgment and thought content normal.  Nursing note and vitals reviewed.  BP 120/73 mmHg  Pulse 58  Temp(Src) 97.5 F (36.4 C) (Oral)  Ht 5\' 4"  (1.626 m)  Wt 168 lb (76.204 kg)  BMI 28.82 kg/m2  Results for orders placed or performed in visit on 06/09/15  POCT rapid strep A  Result Value Ref Range   Rapid Strep A Screen Positive (A) Negative         Assessment & Plan:  1. Streptococcal sore throat -Take amoxicillin 503 times daily for 10 days until completed -Take Tylenol or ibuprofen  as needed for aches pains and fever -Drink plenty of fluids - POCT rapid strep A  Patient Instructions  Take antibiotic as directed Drink plenty of fluids Take tylenol for aches, pains and fever   Arrie Senate MD

## 2015-06-23 ENCOUNTER — Telehealth: Payer: Self-pay | Admitting: *Deleted

## 2015-06-23 NOTE — Telephone Encounter (Signed)
Patient was in recently but a flu shot was not documented or declined. Left message for patient to call back so that we can update her record.

## 2015-07-12 ENCOUNTER — Telehealth: Payer: Self-pay | Admitting: Family Medicine

## 2015-07-12 ENCOUNTER — Encounter: Payer: Self-pay | Admitting: Family Medicine

## 2015-11-17 ENCOUNTER — Encounter: Payer: BLUE CROSS/BLUE SHIELD | Admitting: Nurse Practitioner

## 2015-11-21 ENCOUNTER — Encounter: Payer: BLUE CROSS/BLUE SHIELD | Admitting: *Deleted

## 2016-01-12 ENCOUNTER — Telehealth: Payer: Self-pay | Admitting: Family Medicine

## 2016-02-20 ENCOUNTER — Telehealth: Payer: Self-pay | Admitting: Family Medicine

## 2017-04-25 ENCOUNTER — Encounter: Payer: Self-pay | Admitting: Gastroenterology

## 2019-09-20 ENCOUNTER — Encounter: Payer: Self-pay | Admitting: Gastroenterology

## 2019-10-07 ENCOUNTER — Other Ambulatory Visit: Payer: Self-pay | Admitting: Gastroenterology

## 2019-10-07 ENCOUNTER — Other Ambulatory Visit: Payer: Self-pay

## 2019-10-07 ENCOUNTER — Ambulatory Visit (AMBULATORY_SURGERY_CENTER): Payer: Self-pay | Admitting: *Deleted

## 2019-10-07 VITALS — Temp 96.9°F | Ht 64.0 in | Wt 153.4 lb

## 2019-10-07 DIAGNOSIS — Z1211 Encounter for screening for malignant neoplasm of colon: Secondary | ICD-10-CM

## 2019-10-07 MED ORDER — SUTAB 1479-225-188 MG PO TABS: 1.0000 | ORAL_TABLET | Freq: Once | ORAL | 0 refills | Status: AC

## 2019-10-07 NOTE — Progress Notes (Signed)
Pt states "it took me a long time to wake up after last colonoscopy."  No trouble with intubation or fam/hx malignant hyperthemia.  2nd dose of covid vaccine greater than 2 weeks ago  Pt is aware that care partner will wait in the car during procedure; if they feel like they will be too hot or cold to wait in the car; they may wait in the 4 th floor lobby. Patient is aware to bring only one care partner. We want them to wear a mask (we do not have any that we can provide them), practice social distancing, and we will check their temperatures when they get here.  I did remind the patient that their care partner needs to stay in the parking lot the entire time and have a cell phone available, we will call them when the pt is ready for discharge. Patient will wear mask into building.   No trouble with anesthesia, difficulty with intubation or hx/fam hx of malignant hyperthermia per pt   No egg or soy allergy  No home oxygen use   No medications for weight loss taken  emmi information given  Pt denies constipation issues   Sutab code put into RX and paper copy given to pt to show pharmacy

## 2019-10-08 ENCOUNTER — Other Ambulatory Visit: Payer: Self-pay

## 2019-10-08 MED ORDER — SUTAB 1479-225-188 MG PO TABS: 1.0000 | ORAL_TABLET | Freq: Once | ORAL | 0 refills | Status: AC

## 2019-10-21 ENCOUNTER — Other Ambulatory Visit: Payer: Self-pay

## 2019-10-21 ENCOUNTER — Encounter: Payer: Self-pay | Admitting: Gastroenterology

## 2019-10-21 ENCOUNTER — Ambulatory Visit (AMBULATORY_SURGERY_CENTER): Payer: Medicare Other | Admitting: Gastroenterology

## 2019-10-21 VITALS — BP 117/73 | HR 68 | Temp 97.3°F | Resp 18 | Ht 64.0 in | Wt 153.4 lb

## 2019-10-21 DIAGNOSIS — D123 Benign neoplasm of transverse colon: Secondary | ICD-10-CM

## 2019-10-21 DIAGNOSIS — K635 Polyp of colon: Secondary | ICD-10-CM

## 2019-10-21 DIAGNOSIS — Z1211 Encounter for screening for malignant neoplasm of colon: Secondary | ICD-10-CM

## 2019-10-21 MED ORDER — SODIUM CHLORIDE 0.9 % IV SOLN: 500.0000 mL | Freq: Once | INTRAVENOUS | Status: DC

## 2019-10-21 NOTE — Op Note (Signed)
Tamara Colon Patient Name: Tamara Colon Procedure Date: 10/21/2019 10:03 AM MRN: [id number] Endoscopist: Mauri Pole , MD Age: 69 Referring MD:  Date of Birth: [birth year] Gender: Female Account #: [id number] Procedure:                Colonoscopy Indications:              Screening for colorectal malignant neoplasm Medicines:                Monitored Anesthesia Care Procedure:                Pre-Anesthesia Assessment:                           - Prior to the procedure, a History and Physical                            was performed, and patient medications and                            allergies were reviewed. The patient's tolerance of                            previous anesthesia was also reviewed. The risks                            and benefits of the procedure and the sedation                            options and risks were discussed with the patient.                            All questions were answered, and informed consent                            was obtained. Prior Anticoagulants: The patient has                            taken no previous anticoagulant or antiplatelet                            agents. ASA Grade Assessment: II - A patient with                            mild systemic disease. After reviewing the risks                            and benefits, the patient was deemed in                            satisfactory condition to undergo the procedure.                           After obtaining informed consent, the colonoscope  was passed under direct vision. Throughout the                            procedure, the patient's blood pressure, pulse, and                            oxygen saturations were monitored continuously. The                            Colonoscope was introduced through the anus and                            advanced to the the cecum, identified by                            appendiceal orifice and  ileocecal valve. The                            colonoscopy was performed without difficulty. The                            patient tolerated the procedure well. The quality                            of the bowel preparation was good. The ileocecal                            valve, appendiceal orifice, and rectum were                            photographed. Scope In: 10:14:51 AM Scope Out: 10:41:43 AM Scope Withdrawal Time: 0 hours 19 minutes 49 seconds  Total Procedure Duration: 0 hours 26 minutes 52 seconds  Findings:                 The perianal and digital rectal examinations were                            normal.                           A 11 mm polyp was found in the transverse colon.                            The polyp was flat. The polyp was removed with a                            cold snare. Resection and retrieval were complete.                           Non-bleeding internal hemorrhoids were found during                            retroflexion. The hemorrhoids were small. Complications:  No immediate complications. Estimated Blood Loss:     Estimated blood loss was minimal. Impression:               - One 11 mm polyp in the transverse colon, removed                            with a cold snare. Resected and retrieved.                           - Non-bleeding internal hemorrhoids. Recommendation:           - Patient has a contact number available for                            emergencies. The signs and symptoms of potential                            delayed complications were discussed with the                            patient. Return to normal activities tomorrow.                            Written discharge instructions were provided to the                            patient.                           - Resume previous diet.                           - Continue present medications.                           - Await pathology results.                            - Repeat colonoscopy in 3 - 5 years for                            surveillance based on pathology results. Mauri Pole, MD 10/21/2019 10:49:33 AM This report has been signed electronically.

## 2019-10-21 NOTE — Progress Notes (Signed)
Temp by Tamara Colon by KA  Pt's states no medical or surgical changes since previsit or office visit.

## 2019-10-21 NOTE — Patient Instructions (Signed)
Handouts given for hemorrhoids and polyps.  YOU HAD AN ENDOSCOPIC PROCEDURE TODAY AT THE [facility]:   Refer to the procedure report that was given to you for any specific questions about what was found during the examination.  If the procedure report does not answer your questions, please call your gastroenterologist to clarify.  If you requested that your care partner not be given the details of your procedure findings, then the procedure report has been included in a sealed envelope for you to review at your convenience later.  YOU SHOULD EXPECT: Some feelings of bloating in the abdomen. Passage of more gas than usual.  Walking can help get rid of the air that was put into your GI tract during the procedure and reduce the bloating. If you had a lower endoscopy (such as a colonoscopy or flexible sigmoidoscopy) you may notice spotting of blood in your stool or on the toilet paper. If you underwent a bowel prep for your procedure, you may not have a normal bowel movement for a few days.  Please Note:  You might notice some irritation and congestion in your nose or some drainage.  This is from the oxygen used during your procedure.  There is no need for concern and it should clear up in a day or so.  SYMPTOMS TO REPORT IMMEDIATELY:   Following lower endoscopy (colonoscopy or flexible sigmoidoscopy):  Excessive amounts of blood in the stool  Significant tenderness or worsening of abdominal pains  Swelling of the abdomen that is new, acute  Fever of 100F or higher  For urgent or emergent issues, a gastroenterologist can be reached at any hour by calling [phone]. Do not use MyChart messaging for urgent concerns.    DIET:  We do recommend a small meal at first, but then you may proceed to your regular diet.  Drink plenty of fluids but you should avoid alcoholic beverages for 24 hours.  ACTIVITY:  You should plan to take it easy for the rest of today and you should NOT DRIVE or  use heavy machinery until tomorrow (because of the sedation medicines used during the test).    FOLLOW UP: Our staff will call the number listed on your records 48-72 hours following your procedure to check on you and address any questions or concerns that you may have regarding the information given to you following your procedure. If we do not reach you, we will leave a message.  We will attempt to reach you two times.  During this call, we will ask if you have developed any symptoms of COVID 19. If you develop any symptoms (ie: fever, flu-like symptoms, shortness of breath, cough etc.) before then, please call [phone].  If you test positive for Covid 19 in the 2 weeks post procedure, please call and report this information to us.    If any biopsies were taken you will be contacted by phone or by letter within the next 1-3 weeks.  Please call us at [phone] if you have not heard about the biopsies in 3 weeks.    SIGNATURES/CONFIDENTIALITY: You and/or your care partner have signed paperwork which will be entered into your electronic medical record.  These signatures attest to the fact that that the information above on your After Visit Summary has been reviewed and is understood.  Full responsibility of the confidentiality of this discharge information lies with you and/or your care-partner. 

## 2019-10-21 NOTE — Progress Notes (Signed)
A and O x3. Report to RN. Tolerated MAC anesthesia well.

## 2019-10-21 NOTE — Progress Notes (Signed)
Called to room to assist during endoscopic procedure.  Patient ID and intended procedure confirmed with present staff. Received instructions for my participation in the procedure from the performing physician.  

## 2019-10-25 ENCOUNTER — Telehealth: Payer: Self-pay

## 2019-10-25 NOTE — Telephone Encounter (Signed)
  Follow up Call-  Call back number 10/21/2019  Post procedure Call Back phone  # [phone]  Permission to leave phone message Yes  Some recent data might be hidden     Patient questions:  Do you have a fever, pain , or abdominal swelling? No. Pain Score  0 *  Have you tolerated food without any problems? Yes.    Have you been able to return to your normal activities? Yes.    Do you have any questions about your discharge instructions: Diet   No. Medications  No. Follow up visit  No.  Do you have questions or concerns about your Care? No.  Actions: * If pain score is 4 or above: No action needed, pain <4. 1. Have you developed a fever since your procedure? no  2.   Have you had an respiratory symptoms (SOB or cough) since your procedure? no  3.   Have you tested positive for COVID 19 since your procedure no  4.   Have you had any family members/close contacts diagnosed with the COVID 19 since your procedure?  no   If yes to any of these questions please route to Joylene John, RN and Erenest Rasher, RN

## 2019-10-26 ENCOUNTER — Encounter: Payer: Self-pay | Admitting: Gastroenterology

## 2019-10-28 ENCOUNTER — Telehealth: Payer: Self-pay | Admitting: Gastroenterology

## 2019-10-28 NOTE — Telephone Encounter (Signed)
Patient calling for path results 

## 2019-10-29 NOTE — Telephone Encounter (Signed)
Reviewed Dr Woodward Ku letter with the patient.

## 2020-07-29 ENCOUNTER — Emergency Department (HOSPITAL_COMMUNITY)
Admission: EM | Admit: 2020-07-29 | Discharge: 2020-07-29 | Disposition: A | Discharge status: Home / Self Care | Payer: Medicare Other | Attending: Emergency Medicine | Admitting: Emergency Medicine

## 2020-07-29 ENCOUNTER — Encounter (HOSPITAL_BASED_OUTPATIENT_CLINIC_OR_DEPARTMENT_OTHER): Payer: Self-pay | Admitting: Emergency Medicine

## 2020-07-29 ENCOUNTER — Encounter (HOSPITAL_COMMUNITY): Payer: Self-pay

## 2020-07-29 ENCOUNTER — Other Ambulatory Visit: Payer: Self-pay

## 2020-07-29 ENCOUNTER — Emergency Department (HOSPITAL_BASED_OUTPATIENT_CLINIC_OR_DEPARTMENT_OTHER): Payer: Medicare Other

## 2020-07-29 ENCOUNTER — Emergency Department (HOSPITAL_BASED_OUTPATIENT_CLINIC_OR_DEPARTMENT_OTHER)
Admission: EM | Admit: 2020-07-29 | Discharge: 2020-07-29 | Disposition: A | Discharge status: Home / Self Care | Payer: Medicare Other | Source: Home / Self Care | Attending: Emergency Medicine | Admitting: Emergency Medicine

## 2020-07-29 DIAGNOSIS — K219 Gastro-esophageal reflux disease without esophagitis: Secondary | ICD-10-CM | POA: Diagnosis not present

## 2020-07-29 DIAGNOSIS — R197 Diarrhea, unspecified: Secondary | ICD-10-CM

## 2020-07-29 DIAGNOSIS — R110 Nausea: Secondary | ICD-10-CM | POA: Insufficient documentation

## 2020-07-29 DIAGNOSIS — R109 Unspecified abdominal pain: Secondary | ICD-10-CM | POA: Diagnosis present

## 2020-07-29 DIAGNOSIS — R630 Anorexia: Secondary | ICD-10-CM | POA: Insufficient documentation

## 2020-07-29 DIAGNOSIS — Z91040 Latex allergy status: Secondary | ICD-10-CM | POA: Insufficient documentation

## 2020-07-29 DIAGNOSIS — E860 Dehydration: Secondary | ICD-10-CM

## 2020-07-29 DIAGNOSIS — Z5321 Procedure and treatment not carried out due to patient leaving prior to being seen by health care provider: Secondary | ICD-10-CM | POA: Insufficient documentation

## 2020-07-29 DIAGNOSIS — K529 Noninfective gastroenteritis and colitis, unspecified: Secondary | ICD-10-CM

## 2020-07-29 LAB — CBC
HCT: 41 % (ref 36.0–46.0)
Hemoglobin: 14 g/dL (ref 12.0–15.0)
MCH: 31.4 pg (ref 26.0–34.0)
MCHC: 34.1 g/dL (ref 30.0–36.0)
MCV: 91.9 fL (ref 80.0–100.0)
Platelets: 314 10*3/uL (ref 150–400)
RBC: 4.46 MIL/uL (ref 3.87–5.11)
RDW: 12.1 % (ref 11.5–15.5)
WBC: 8.2 10*3/uL (ref 4.0–10.5)
nRBC: 0 % (ref 0.0–0.2)

## 2020-07-29 LAB — COMPREHENSIVE METABOLIC PANEL
ALT: 21 U/L (ref 0–44)
AST: 16 U/L (ref 15–41)
Albumin: 4.2 g/dL (ref 3.5–5.0)
Alkaline Phosphatase: 62 U/L (ref 38–126)
Anion gap: 10 (ref 5–15)
BUN: 7 mg/dL — ABNORMAL LOW (ref 8–23)
CO2: 27 mmol/L (ref 22–32)
Calcium: 9 mg/dL (ref 8.9–10.3)
Chloride: 104 mmol/L (ref 98–111)
Creatinine, Ser: 0.68 mg/dL (ref 0.44–1.00)
GFR, Estimated: >60 mL/min (ref >60)
Glucose, Bld: 113 mg/dL — ABNORMAL HIGH (ref 70–99)
Potassium: 3.4 mmol/L — ABNORMAL LOW (ref 3.5–5.1)
Sodium: 141 mmol/L (ref 135–145)
Total Bilirubin: 0.6 mg/dL (ref 0.3–1.2)
Total Protein: 7.7 g/dL (ref 6.5–8.1)

## 2020-07-29 LAB — LIPASE, BLOOD: Lipase: 27 U/L (ref 11–51)

## 2020-07-29 MED ORDER — IOHEXOL 300 MG/ML  SOLN
100.0000 mL | Freq: Once | INTRAMUSCULAR | Status: AC | PRN
  Administered 2020-07-29: 100 mL via INTRAVENOUS

## 2020-07-29 MED ORDER — SODIUM CHLORIDE 0.9 % IV BOLUS
1000.0000 mL | Freq: Once | INTRAVENOUS | Status: AC
  Administered 2020-07-29: 1000 mL via INTRAVENOUS

## 2020-07-29 MED ORDER — HYDROMORPHONE HCL 1 MG/ML IJ SOLN
0.5000 mg | Freq: Once | INTRAMUSCULAR | Status: DC
  Filled 2020-07-29: qty 1

## 2020-07-29 MED ORDER — LACTATED RINGERS IV BOLUS
1000.0000 mL | Freq: Once | INTRAVENOUS | Status: AC
  Administered 2020-07-29: 1000 mL via INTRAVENOUS

## 2020-07-29 MED ORDER — ONDANSETRON HCL 4 MG/2ML IJ SOLN
4.0000 mg | Freq: Once | INTRAMUSCULAR | Status: DC
  Filled 2020-07-29: qty 2

## 2020-07-29 NOTE — ED Triage Notes (Signed)
Pt presents with c/o diarrhea and some left side abdominal pain. Pt reports she has been on antibiotics for a UTI since 1/6, reports multiple antibiotics have been used. Pt reports the pain in the left side of her abdomen has been present for several months. Pt reports diarrhea present for 4 days.

## 2020-07-29 NOTE — ED Provider Notes (Signed)
[facility] EMERGENCY DEPARTMENT Provider Note   CSN: [id number] Arrival date & time: 07/29/20  1506     History Chief Complaint  Patient presents with  . Diarrhea    Nettie Appelhans is a 70 y.o. female.  Patient c/o abd pain and diarrhea. Symptoms acute onset four days ago, constant, dull, mild, left sided, non radiating discomfort - pt indicates mainly bothered last night when would try to lie or one side or the other - no change w eating or post eating. No hx same symptoms. Diarrhea is several episodes per day, loose to watery, yellowish, not bloody. No abd distension. +nausea and decreased appetite. No vomiting. Denies back or flank pain. No dysuria or gu c/o. No specific known ill contacts or bad food ingestion. Has recently been on a couple course of antibiotic for possible uti. No hx c diff. No fever or chills.   The history is provided by the patient and a relative.  Diarrhea Associated symptoms: abdominal pain   Associated symptoms: no chills, no fever, no headaches and no vomiting        Past Medical History:  Diagnosis Date  . GERD (gastroesophageal reflux disease)    watches diet  . History of concussion    AS CHILD--  NO RESIDUAL  . Osteoporosis   . Perianal fistula     Patient Active Problem List   Diagnosis Date Noted  . CARDIAC ARRHYTHMIA 09/18/2007  . HEMORRHOIDS 09/18/2007  . 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 ESOPHAGEAL STRICTURE 09/18/2007  . HIATAL HERNIA 09/18/2007  . RECTAL BLEEDING 09/18/2007  . CHOLELITHIASIS, HX OF 09/18/2007    Past Surgical History:  Procedure Laterality Date  . ANAL FISTULOTOMY N/A 03/02/2014   Procedure: EXAM UNDER ANESTHESIA,FISTULOTOMY.;  Surgeon: Leighton Ruff, MD;  Location[facility];  Service: General;  Laterality: N/A;  . CARPAL TUNNEL RELEASE Right 01-19-2008  . COLONOSCOPY  04-10-2007  . LAPAROSCOPIC CHOLECYSTECTOMY  05-19-2002  . ORIF RIGHT HIP FX  1995   HARDWARE REMOVED  .  TONSILLECTOMY  as child  . TOTAL ABDOMINAL HYSTERECTOMY W/ BILATERAL SALPINGOOPHORECTOMY  1990  . TUBAL LIGATION       OB History   No obstetric history on file.     Family History  Problem Relation Age of Onset  . Cancer Mother        bladder  . Diabetes Father   . Cancer Father        prostate  . Cancer Brother        prostate  . Colon cancer Neg Hx   . Esophageal cancer Neg Hx   . Rectal cancer Neg Hx   . Stomach cancer Neg Hx     Social History   Tobacco Use  . Smoking status: Never Smoker  . Smokeless tobacco: Never Used  Vaping Use  . Vaping Use: Never used  Substance Use Topics  . Alcohol use: No  . Drug use: No    Home Medications Prior to Admission medications   Medication Sig Start Date End Date Taking? Authorizing Provider  Bioflavonoid Products (ESTER C PO) Take 250 mg by mouth daily.    [provider]  calcium citrate-vitamin D (CITRACAL+D) 315-200 MG-UNIT tablet Take by mouth.    [provider]  ibuprofen (ADVIL,MOTRIN) 200 MG tablet Take 400 mg by mouth every 6 (six) hours as needed for mild pain.     [provider]  Multiple Vitamins-Minerals (MULTIVITAMIN WITH MINERALS) tablet Take 1 tablet by  mouth daily.    [provider]  Ubiquinol 100 MG CAPS Take by mouth daily.    [provider]    Allergies    Sulfa antibiotics, Latex, Other, and Tape  Review of Systems   Review of Systems  Constitutional: Negative for chills and fever.  HENT: Negative for sore throat.   Eyes: Negative for redness.  Respiratory: Negative for cough and shortness of breath.   Cardiovascular: Negative for chest pain.  Gastrointestinal: Positive for abdominal pain and diarrhea. Negative for vomiting.  Endocrine: Negative for polyuria.  Genitourinary: Negative for dysuria and flank pain.  Musculoskeletal: Negative for back pain and neck pain.  Skin: Negative for rash.  Neurological: Negative for headaches.   Hematological: Does not bruise/bleed easily.  Psychiatric/Behavioral: Negative for confusion.    Physical Exam Updated Vital Signs BP 126/69 (BP Location: Right Arm)   Pulse (!) 102   Temp 99.3 F (37.4 C) (Oral)   Resp 18   Ht 1.626 m (5\' 4" )   Wt 68.5 kg   SpO2 100%   BMI 25.92 kg/m   Physical Exam Vitals and nursing note reviewed.  Constitutional:      Appearance: Normal appearance. She is well-developed.  HENT:     Head: Atraumatic.     Nose: Nose normal.     Mouth/Throat:     Mouth: Mucous membranes are moist.  Eyes:     General: No scleral icterus.    Conjunctiva/sclera: Conjunctivae normal.  Neck:     Trachea: No tracheal deviation.  Cardiovascular:     Rate and Rhythm: Normal rate and regular rhythm.     Pulses: Normal pulses.     Heart sounds: Normal heart sounds. No murmur heard. No friction rub. No gallop.   Pulmonary:     Effort: Pulmonary effort is normal. No respiratory distress.     Breath sounds: Normal breath sounds.  Abdominal:     General: Bowel sounds are normal. There is no distension.     Palpations: Abdomen is soft. There is no mass.     Tenderness: There is abdominal tenderness. There is no guarding or rebound.     Hernia: No hernia is present.     Comments: Mild left abd tenderness. No peritoneal signs.   Genitourinary:    Comments: No cva tenderness.  Musculoskeletal:        General: No swelling.     Cervical back: Normal range of motion and neck supple. No rigidity. No muscular tenderness.  Skin:    General: Skin is warm and dry.     Findings: No rash.  Neurological:     Mental Status: She is alert.     Comments: Alert, speech normal.   Psychiatric:        Mood and Affect: Mood normal.     ED Results / Procedures / Treatments   Labs (all labs ordered are listed, but only abnormal results are displayed) Results for orders placed or performed during the hospital encounter of 07/29/20  Lipase, blood  Result Value Ref Range    Lipase 27 11 - 51 U/L  Comprehensive metabolic panel  Result Value Ref Range   Sodium 141 135 - 145 mmol/L   Potassium 3.4 (L) 3.5 - 5.1 mmol/L   Chloride 104 98 - 111 mmol/L   CO2 27 22 - 32 mmol/L   Glucose, Bld 113 (H) 70 - 99 mg/dL   BUN 7 (L) 8 - 23 mg/dL   Creatinine, Ser 0.68  0.44 - 1.00 mg/dL   Calcium 9.0 8.9 - 10.3 mg/dL   Total Protein 7.7 6.5 - 8.1 g/dL   Albumin 4.2 3.5 - 5.0 g/dL   AST 16 15 - 41 U/L   ALT 21 0 - 44 U/L   Alkaline Phosphatase 62 38 - 126 U/L   Total Bilirubin 0.6 0.3 - 1.2 mg/dL   GFR, Estimated >60 >60 mL/min   Anion gap 10 5 - 15  CBC  Result Value Ref Range   WBC 8.2 4.0 - 10.5 K/uL   RBC 4.46 3.87 - 5.11 MIL/uL   Hemoglobin 14.0 12.0 - 15.0 g/dL   HCT 41.0 36.0 - 46.0 %   MCV 91.9 80.0 - 100.0 fL   MCH 31.4 26.0 - 34.0 pg   MCHC 34.1 30.0 - 36.0 g/dL   RDW 12.1 11.5 - 15.5 %   Platelets 314 150 - 400 K/uL   nRBC 0.0 0.0 - 0.2 %   EKG None  Radiology CT Abdomen Pelvis W Contrast  Result Date: 07/29/2020 CLINICAL DATA:  Acute abdominal pain. EXAM: CT ABDOMEN AND PELVIS WITH CONTRAST TECHNIQUE: Multidetector CT imaging of the abdomen and pelvis was performed using the standard protocol following bolus administration of intravenous contrast. CONTRAST:  152mL OMNIPAQUE IOHEXOL 300 MG/ML  SOLN COMPARISON:  May 12, 2018 FINDINGS: Lower chest: No acute abnormality.  Small hiatal hernia. Hepatobiliary: 8 mm too small to be actually characterize by CT hypoattenuated subcapsular lesion in the right lobe of the liver. Pancreas: Unremarkable. No pancreatic ductal dilatation or surrounding inflammatory changes. Spleen: Normal in size without focal abnormality. Adrenals/Urinary Tract: Normal appearance of the adrenal glands. Cortical scarring in the upper pole of the left kidney. No hydronephrosis or nephrolithiasis. Stomach/Bowel: Normal appearance of the stomach and small bowel. Diffuse circumferential mucosal thickening of the colon, suggestive of  infectious or inflammatory colitis. Vascular/Lymphatic: No significant vascular findings are present. No enlarged abdominal or pelvic lymph nodes. Shotty mesenteric lymph nodes noted, nonspecific. Reproductive: Status post hysterectomy. No adnexal masses. Other: No abdominal wall hernia or abnormality. No abdominopelvic ascites. Musculoskeletal: No acute or significant osseous findings. IMPRESSION: 1. Diffuse circumferential mucosal thickening of the colon, suggestive of infectious or inflammatory colitis. 2. Shotty mesenteric lymph nodes, nonspecific. 3. 8 mm too small to be actually characterize by CT hypoattenuated subcapsular lesion in the right lobe of the liver, stable from 2019 and therefore likely benign. Electronically Signed   By: Fidela Salisbury M.D.   On: 07/29/2020 18:06    Procedures Procedures   Medications Ordered in ED Medications  sodium chloride 0.9 % bolus 1,000 mL (has no administration in time range)  HYDROmorphone (DILAUDID) injection 0.5 mg (has no administration in time range)  ondansetron (ZOFRAN) injection 4 mg (has no administration in time range)    ED Course  I have reviewed the triage vital signs and the nursing notes.  Pertinent labs & imaging results that were available during my care of the patient were reviewed by me and considered in my medical decision making (see chart for details).  MDM Rules/Calculators/A&P                         Iv ns bolus. Dilaudid iv, zofran iv.   Reviewed nursing notes and prior charts for additional history.  [facility] ED triage visit reviewed and labs reviewed.   Labs reviewed/interpreted by me - wbc normal.   CT imaging ordered. Will send for C diff testing as  well.   CT reviewed/interpreted by me - ?colitis.   On recheck of patient, denies abd pain. Abd is soft and non tender. C diff is pending - pt with no Bm in ED.  Additional ivf given.   Trial of po fluids, food.   Pt tolerates po. Vitals normal. Pt is afebrile.    RN indicates patient/family requesting d/c.   On additional recheck, no abd pain. Abd is soft and non tender.  Pt not able to give stool sample, and last bm is from 8 AM today - as symptoms improved, seems less likely for c diff colitis, or other acute/severe colitis. Given recent abx therapy, concern as possible contributing factor to diarrhea/possible c diff, given improved, no abd pain or tenderness, do not feel additional abx therapy now would be helpful, and would be potentially harmful.   Pt/fam request d/c to home.  Discussed diff dx - will send home with cup/stool sample, and plan for recheck tomorrow.  Currently vitals normal, no pain, no recurrent diarrhea, no vomiting, benign abd exam - pt currently appears stable for d/c.   Return precautions provided.   Final Clinical Impression(s) / ED Diagnoses Final diagnoses:  None    Rx / DC Orders ED Discharge Orders    None       Lajean Saver, MD 07/29/20 2152

## 2020-07-29 NOTE — ED Notes (Signed)
Provided supplies for stool collection

## 2020-07-29 NOTE — Discharge Instructions (Addendum)
It was our pleasure to provide your ER care today - we hope that you feel better.  Your ct scan was read as showing colitis. Incidental note was also made of a small 8 mm liver lesion, not significantly changed from prior imaging study.    The lab tests showed that your potassium level was slightly low (3,4) - eat plenty of fruits and vegetables.   Rest. Drink plenty of fluids.   If you have diarrheal stool, place in sample cup and return in AM tomorrow for testing.   As specific cause of colitis is not known, return tomorrow for recheck (if symptoms fail to improve/resolve).   Return to ER right away if worse, new symptoms, worsening or severe pain, persistent vomiting, high fevers, or other concern.

## 2020-07-29 NOTE — ED Triage Notes (Signed)
Pt presents with c/o diarrhea x 4 days and left side abdominal and flank pain.. Pt reports multiple abx since 1/6 for UTI, was in [facility] at [place], left after long wait. Labs drawn

## 2020-07-30 ENCOUNTER — Telehealth (HOSPITAL_BASED_OUTPATIENT_CLINIC_OR_DEPARTMENT_OTHER): Payer: Self-pay | Admitting: Emergency Medicine

## 2020-07-30 LAB — CLOSTRIDIUM DIFFICILE BY PCR, REFLEXED: Toxigenic C. Difficile by PCR: POSITIVE — AB

## 2020-07-31 LAB — C DIFFICILE QUICK SCREEN W PCR REFLEX
C Diff antigen: INVALID — AB
C Diff toxin: INVALID — AB

## 2022-02-18 IMAGING — CT CT ABD-PELV W/ CM
2 of 4 series · 13 of 36 positions shown, 18 images · IV contrast (omnipaque)
Comparison: May 12, 2018

CLINICAL DATA: Acute abdominal pain.

EXAM:
CT ABDOMEN AND PELVIS WITH CONTRAST
TECHNIQUE: Multidetector CT imaging of the abdomen and pelvis was performed
using the standard protocol following bolus administration of
intravenous contrast.
CONTRAST:  100mL OMNIPAQUE IOHEXOL 300 MG/ML  SOLN

[Series 2: axial st · axial · 0.80mm/px · z∈[-384,-19]mm · 12 of 83 slices shown, 16 images]
[im 5/83  soft-tissue]
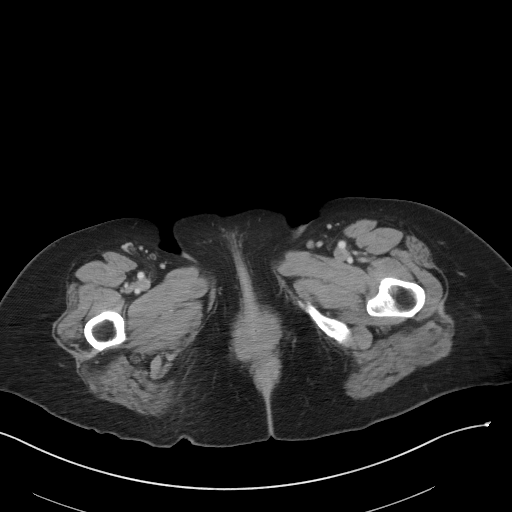
[im 5/83  bone]
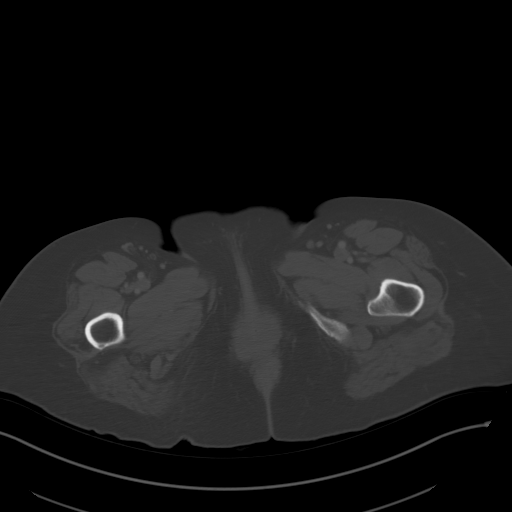
[im 13/83  soft-tissue]
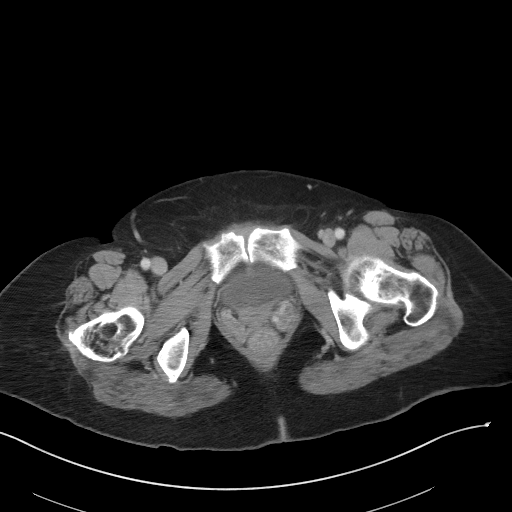
[im 22/83  soft-tissue]
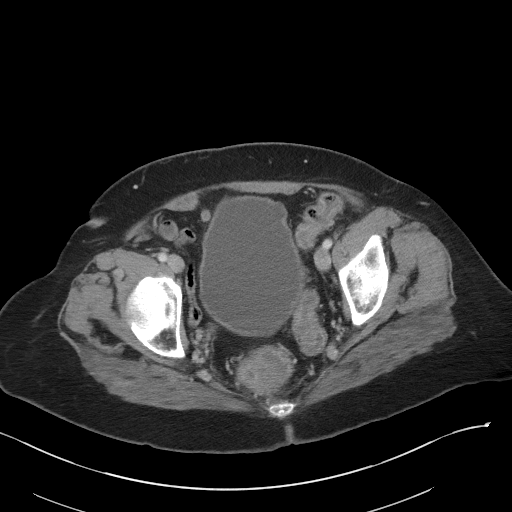
[im 31/83  soft-tissue]
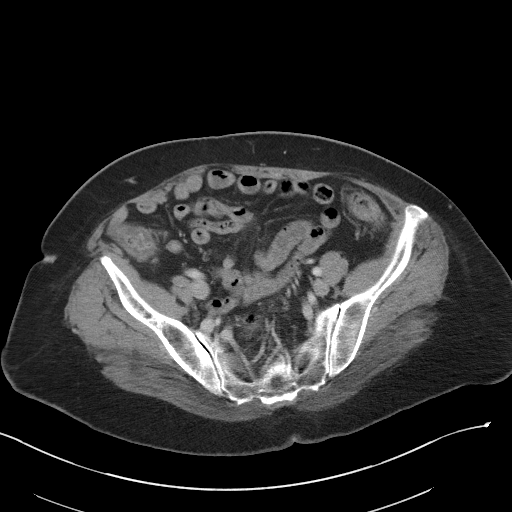
[im 39/83  soft-tissue]
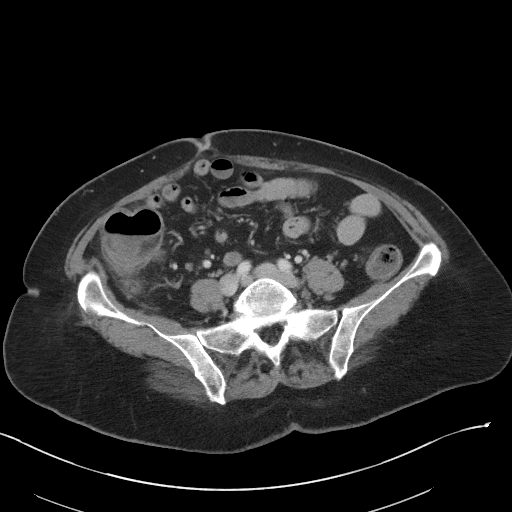
[im 44/83  soft-tissue]
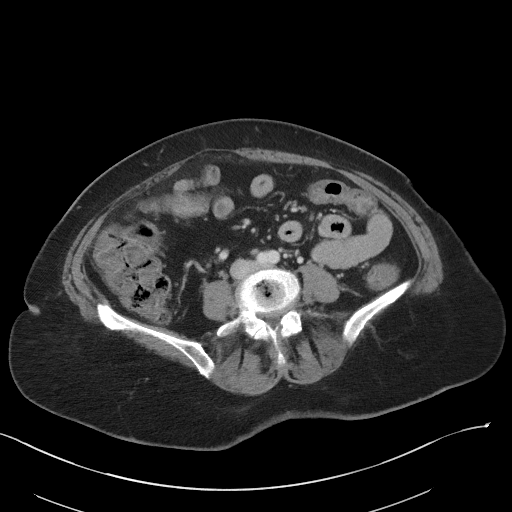
[im 52/83  soft-tissue]
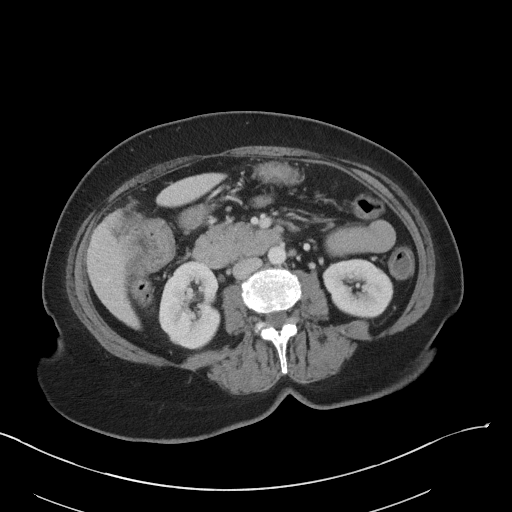
[im 61/83  soft-tissue]
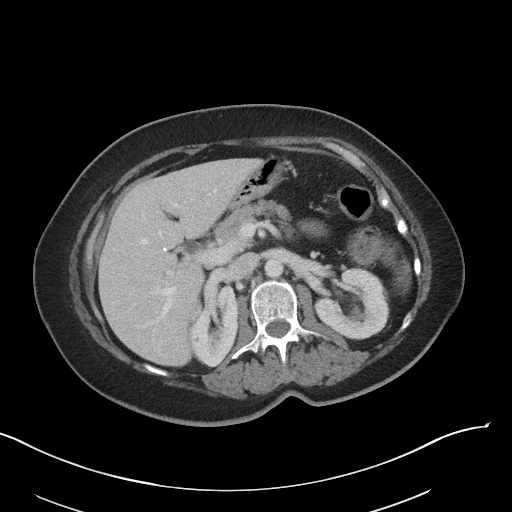
[im 65/83  lung]
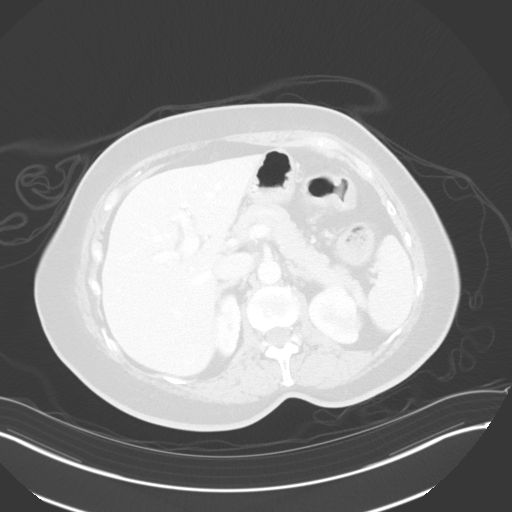
[im 70/83  soft-tissue]
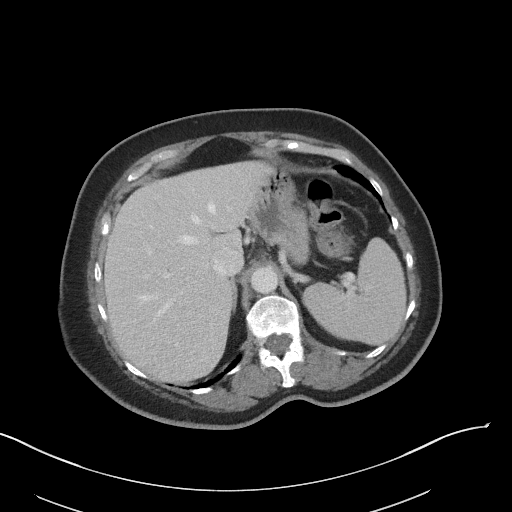
[im 70/83  lung]
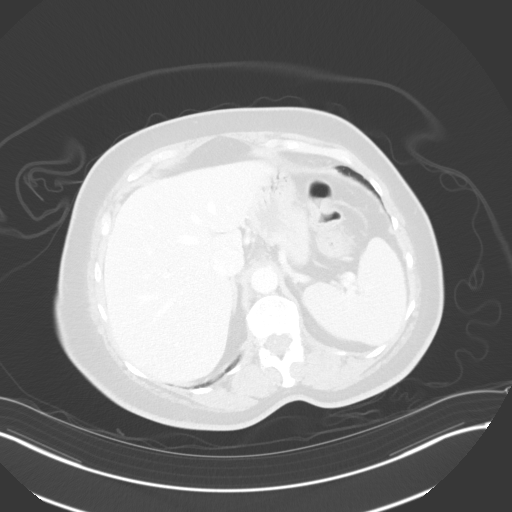
[im 70/83  bone]
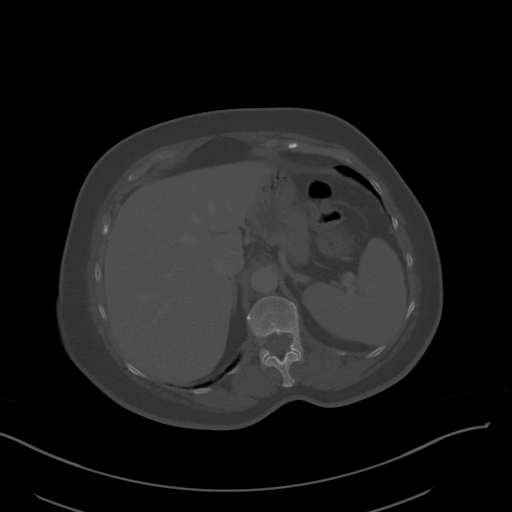
[im 74/83  lung]
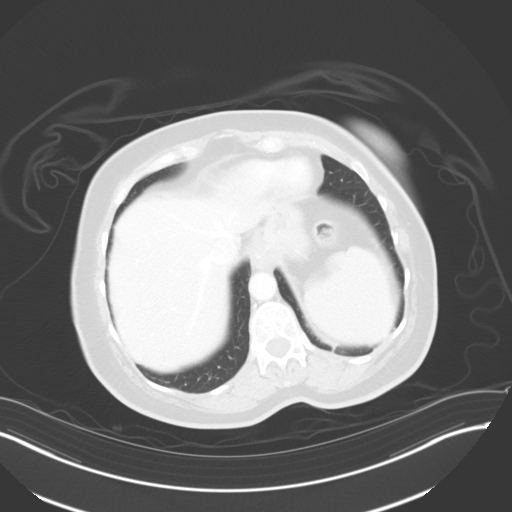
[im 78/83  soft-tissue]
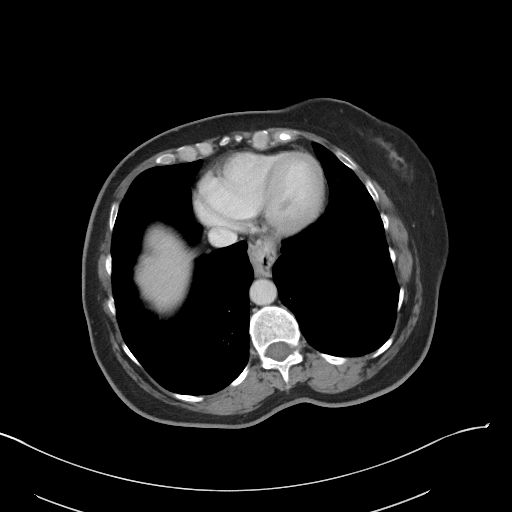
[im 78/83  lung]
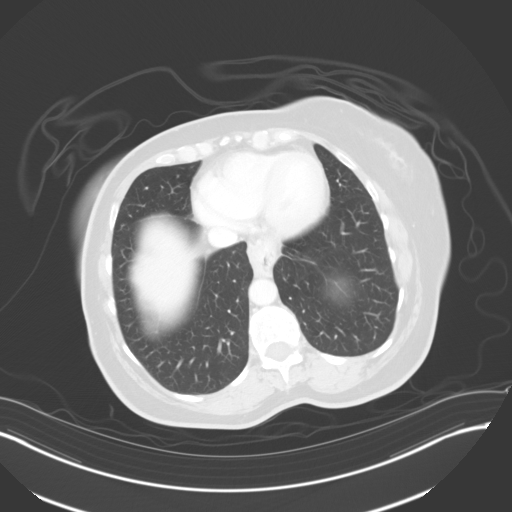

[Series 6: sagittal st · sagittal · 0.71mm/px · 1 of 149 slices shown, 2 images]
[im 50/149  soft-tissue]
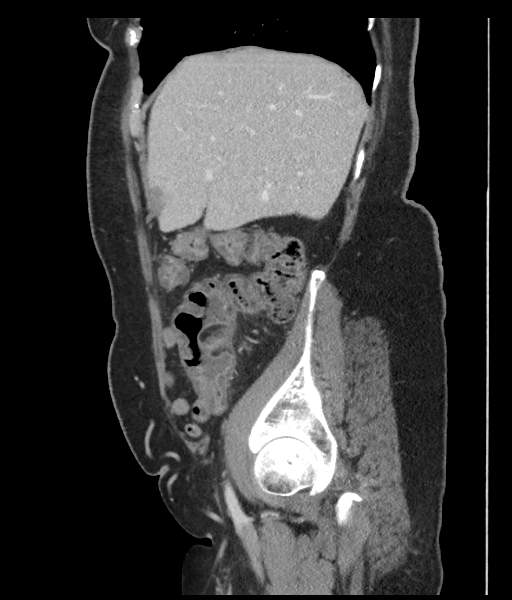
[im 50/149  bone]
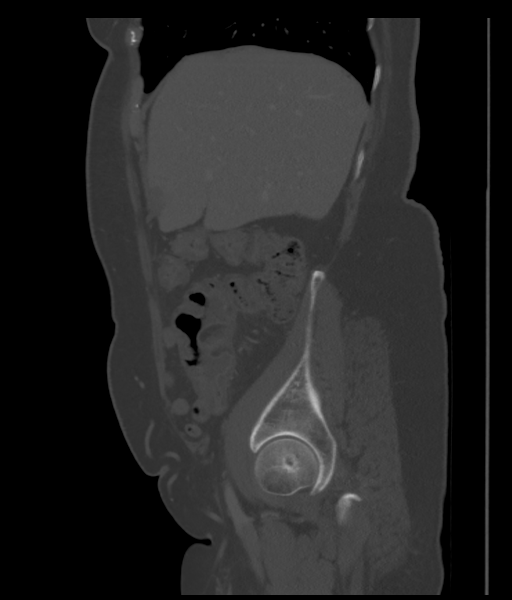

[13 of 36 positions shown; findings below may reference images not displayed]

FINDINGS: Lower chest: No acute abnormality.  Small hiatal hernia.

Hepatobiliary: 8 mm too small to be actually characterize by CT
hypoattenuated subcapsular lesion in the right lobe of the liver.

Pancreas: Unremarkable. No pancreatic ductal dilatation or
surrounding inflammatory changes.

Spleen: Normal in size without focal abnormality.

Adrenals/Urinary Tract: Normal appearance of the adrenal glands.
Cortical scarring in the upper pole of the left kidney. No
hydronephrosis or nephrolithiasis.

Stomach/Bowel: Normal appearance of the stomach and small bowel.
Diffuse circumferential mucosal thickening of the colon, suggestive
of infectious or inflammatory colitis.

Vascular/Lymphatic: No significant vascular findings are present. No
enlarged abdominal or pelvic lymph nodes. Shotty mesenteric lymph
nodes noted, nonspecific.

Reproductive: Status post hysterectomy. No adnexal masses.

Other: No abdominal wall hernia or abnormality. No abdominopelvic
ascites.

Musculoskeletal: No acute or significant osseous findings.
IMPRESSION: 1. Diffuse circumferential mucosal thickening of the colon,
suggestive of infectious or inflammatory colitis.
2. Shotty mesenteric lymph nodes, nonspecific.
3. 8 mm too small to be actually characterize by CT hypoattenuated
subcapsular lesion in the right lobe of the liver, stable from 1470
and therefore likely benign.

## 2022-10-23 ENCOUNTER — Encounter: Payer: Self-pay | Admitting: Gastroenterology

## 2023-07-30 ENCOUNTER — Encounter: Payer: Self-pay | Admitting: Gastroenterology

## 2023-08-29 ENCOUNTER — Ambulatory Visit: Payer: Medicare Other

## 2023-08-29 VITALS — Ht 64.0 in | Wt 170.0 lb

## 2023-08-29 DIAGNOSIS — Z860100 Personal history of colon polyps, unspecified: Secondary | ICD-10-CM

## 2023-08-29 MED ORDER — NA SULFATE-K SULFATE-MG SULF 17.5-3.13-1.6 GM/177ML PO SOLN: 1.0000 | Freq: Once | ORAL | 0 refills | Status: AC

## 2023-08-29 NOTE — Progress Notes (Signed)
 No egg or soy allergy known to patient   No issues known to pt with past sedation with any surgeries or procedures  Patient denies ever being told they had issues or difficulty with intubation   No FH of Malignant Hyperthermia Pt is not on diet pills  Pt is not on  home 02  Pt is not on blood thinners   Pt denies issues with constipation   No A fib or A flutter  Have any cardiac testing pending--no  Suprep sent to local rx per pt preference And due to procedure is only 2 weeks away  Patient's chart reviewed by Cathlyn Parsons CRNA prior to previsit and patient appropriate for the [facility].  Previsit completed and red dot placed by patient's name on their procedure day (on provider's schedule).

## 2023-09-11 ENCOUNTER — Telehealth: Payer: Self-pay | Admitting: Gastroenterology

## 2023-09-11 NOTE — Telephone Encounter (Signed)
 Spoke with patient advised she is ok to proceed.

## 2023-09-11 NOTE — Telephone Encounter (Signed)
 Inbound call from patient stating that she is scheduled for a colonoscopy on 3/24 at 9:30 with Dr. Lavon Paganini. Patient states that she was looking over her instructions and noticed that she was not supposed to have beans or nuts. She stated she had forgotten and had beans yesterday and nuts in a kind bar this morning. Patient is requesting a call back to discuss. Please advise.

## 2023-09-15 ENCOUNTER — Encounter: Payer: Self-pay | Admitting: Gastroenterology

## 2023-09-15 ENCOUNTER — Ambulatory Visit (AMBULATORY_SURGERY_CENTER): Payer: Medicare Other | Admitting: Gastroenterology

## 2023-09-15 VITALS — BP 124/60 | HR 69 | Temp 97.4°F | Resp 17 | Ht 64.0 in | Wt 170.0 lb

## 2023-09-15 DIAGNOSIS — Z860100 Personal history of colon polyps, unspecified: Secondary | ICD-10-CM

## 2023-09-15 DIAGNOSIS — K635 Polyp of colon: Secondary | ICD-10-CM

## 2023-09-15 DIAGNOSIS — Z1211 Encounter for screening for malignant neoplasm of colon: Secondary | ICD-10-CM | POA: Diagnosis not present

## 2023-09-15 DIAGNOSIS — D125 Benign neoplasm of sigmoid colon: Secondary | ICD-10-CM

## 2023-09-15 DIAGNOSIS — Z860101 Personal history of adenomatous and serrated colon polyps: Secondary | ICD-10-CM

## 2023-09-15 DIAGNOSIS — K644 Residual hemorrhoidal skin tags: Secondary | ICD-10-CM

## 2023-09-15 DIAGNOSIS — K648 Other hemorrhoids: Secondary | ICD-10-CM

## 2023-09-15 MED ORDER — SODIUM CHLORIDE 0.9 % IV SOLN: 500.0000 mL | Freq: Once | INTRAVENOUS | Status: DC

## 2023-09-15 NOTE — Progress Notes (Signed)
 [facility] History and Physical   Primary Care Physician:  Hilarie Fredrickson, MD   Reason for Procedure:  History of adenomatous colon polyps  Plan:    Surveillance colonoscopy with possible interventions as needed     HPI: Tamara Colon is a very pleasant 73 y.o. female here for surveillance colonoscopy. Denies any nausea, vomiting, abdominal pain, melena or bright red blood per rectum  The risks and benefits as well as alternatives of endoscopic procedure(s) have been discussed and reviewed. All questions answered. The patient agrees to proceed.    Past Medical History:  Diagnosis Date   GERD (gastroesophageal reflux disease)    watches diet   Hiatal hernia    History of concussion    AS CHILD--  NO RESIDUAL   Osteoporosis    Perianal fistula     Past Surgical History:  Procedure Laterality Date   ANAL FISTULOTOMY N/A 03/02/2014   Procedure: EXAM UNDER ANESTHESIA,FISTULOTOMY.;  Surgeon: Romie Levee, MD;  Location: [facility];  Service: General;  Laterality: N/A;   CARPAL TUNNEL RELEASE Right 01/19/2008   COLONOSCOPY  04/10/2007   LAPAROSCOPIC CHOLECYSTECTOMY  05/19/2002   ORIF RIGHT HIP FX  1995   HARDWARE REMOVED   TONSILLECTOMY  as child   TOTAL ABDOMINAL HYSTERECTOMY W/ BILATERAL SALPINGOOPHORECTOMY  1990   TUBAL LIGATION     UPPER GASTROINTESTINAL ENDOSCOPY      Prior to Admission medications   Medication Sig Start Date End Date Taking? Authorizing Provider  Bioflavonoid Products (ESTER C PO) Take 250 mg by mouth daily.   Yes [provider]  Lactobacillus (PROBIOTIC ACIDOPHILUS PO) Take by mouth. 25 billion   Yes [provider]  Misc Natural Products (ELDERBERRY ZINC/VIT C/IMMUNE MT) Use as directed in the mouth or throat.   Yes [provider]  Multiple Vitamins-Minerals (MULTIVITAMIN WITH MINERALS) tablet Take 1 tablet by mouth daily.   Yes [provider]  tamsulosin (FLOMAX) 0.4 MG CAPS  capsule Take 0.4 mg by mouth daily.   Yes [provider]  calcium citrate-vitamin D (CITRACAL+D) 315-200 MG-UNIT tablet Take by mouth.    [provider]  Cholecalciferol (D3 5000 PO) Take by mouth. In winter 5 day a week    [provider]  ibuprofen (ADVIL,MOTRIN) 200 MG tablet Take 400 mg by mouth every 6 (six) hours as needed for mild pain.     [provider]  Menaquinone-7 (VITAMIN K2 PO) Take by mouth daily. 150 mcg 5 days a week with D3 in winter    [provider]  Misc Natural Products (TOTAL MEMORY & FOCUS FORMULA PO) Take 1 capsule by mouth daily at 8 pm. Co q 10,  100 mg, nutri p qq, 10 mg, resveratrol 100 mg , quercetin 100 mg, cititoline 100 mg, biotin 12.5 mg    [provider]  Omega-3 Fatty Acids (FISH OIL) 1000 MG CAPS Take by mouth.    [provider]  Ubiquinol 100 MG CAPS Take by mouth daily.    [provider]  Zinc Citrate 16.67 MG CHEW Chew by mouth.    [provider]    Current Outpatient Medications  Medication Sig Dispense Refill   Bioflavonoid Products (ESTER C PO) Take 250 mg by mouth daily.     Lactobacillus (PROBIOTIC ACIDOPHILUS PO) Take by mouth. 25 billion     Misc Natural Products (ELDERBERRY ZINC/VIT C/IMMUNE MT) Use as directed in the mouth or throat.     Multiple Vitamins-Minerals (  MULTIVITAMIN WITH MINERALS) tablet Take 1 tablet by mouth daily.     tamsulosin (FLOMAX) 0.4 MG CAPS capsule Take 0.4 mg by mouth daily.     calcium citrate-vitamin D (CITRACAL+D) 315-200 MG-UNIT tablet Take by mouth.     Cholecalciferol (D3 5000 PO) Take by mouth. In winter 5 day a week     ibuprofen (ADVIL,MOTRIN) 200 MG tablet Take 400 mg by mouth every 6 (six) hours as needed for mild pain.      Menaquinone-7 (VITAMIN K2 PO) Take by mouth daily. 150 mcg 5 days a week with D3 in winter     Misc Natural Products (TOTAL MEMORY & FOCUS FORMULA PO) Take 1 capsule by mouth daily at 8 pm. Co q 10,   100 mg, nutri p qq, 10 mg, resveratrol 100 mg , quercetin 100 mg, cititoline 100 mg, biotin 12.5 mg     Omega-3 Fatty Acids (FISH OIL) 1000 MG CAPS Take by mouth.     Ubiquinol 100 MG CAPS Take by mouth daily.     Zinc Citrate 16.67 MG CHEW Chew by mouth.     Current Facility-Administered Medications  Medication Dose Route Frequency Provider Last Rate Last Admin   0.9 %  sodium chloride infusion  500 mL Intravenous Once Napoleon Form, MD        Allergies as of 09/15/2023 - Review Complete 08/29/2023  Allergen Reaction Noted   Fire ant Rash 09/07/2021   Latex Rash 02/23/2014   Other Rash 02/23/2014   Sulfa antibiotics Other (See Comments) 08/30/2013   Tape Rash 08/30/2013    Family History  Problem Relation Age of Onset   Cancer Mother        bladder   Diabetes Father    Cancer Father        prostate   Cancer Brother        prostate   Colon cancer Neg Hx    Esophageal cancer Neg Hx    Rectal cancer Neg Hx    Stomach cancer Neg Hx    Colon polyps Neg Hx     Social History   Socioeconomic History   Marital status: Married    Spouse name: Not on file   Number of children: Not on file   Years of education: Not on file   Highest education level: Not on file  Occupational History   Not on file  Tobacco Use   Smoking status: Never   Smokeless tobacco: Never  Vaping Use   Vaping status: Never Used  Substance and Sexual Activity   Alcohol use: No   Drug use: No   Sexual activity: Not on file  Other Topics Concern   Not on file  Social History Narrative   Not on file   Social Drivers of Health   Financial Resource Strain: Low Risk  (09/01/2023)   Received from [facility]   Overall Financial Resource Strain (CARDIA)    Difficulty of Paying Living Expenses: Not hard at all  Food Insecurity: No Food Insecurity (09/01/2023)   Received from [facility]   Hunger Vital Sign    Worried About Running Out of Food in the Last Year: Never true    Ran Out of Food  in the Last Year: Never true  Transportation Needs: No Transportation Needs (09/01/2023)   Received from [facility] - Transportation    Lack of Transportation (Medical): No    Lack of Transportation (Non-Medical): No  Physical Activity: Sufficiently Active (  09/01/2023)   Received from [facility]   Exercise Vital Sign    Days of Exercise per Week: 7 days    Minutes of Exercise per Session: 30 min  Stress: No Stress Concern Present (09/01/2023)   Received from [facility] - Occupational Stress Questionnaire    Feeling of Stress : Only a little  Social Connections: Socially Integrated (09/01/2023)   Received from [facility]   Social Network    How would you rate your social network (family, work, friends)?: Good participation with social networks  Intimate Partner Violence: Not At Risk (09/01/2023)   Received from [facility]   HITS    Over the last 12 months how often did your partner physically hurt you?: Never    Over the last 12 months how often did your partner insult you or talk down to you?: Never    Over the last 12 months how often did your partner threaten you with physical harm?: Never    Over the last 12 months how often did your partner scream or curse at you?: Never    Review of Systems:  All other review of systems negative except as mentioned in the HPI.  Physical Exam: Vital signs in last 24 hours: BP 125/75   Pulse 83   Temp (!) 97.4 F (36.3 C)   Ht 5\' 4"  (1.626 m)   Wt 170 lb (77.1 kg)   SpO2 96%   BMI 29.18 kg/m  General:   Alert, NAD Lungs:  Clear .   Heart:  Regular rate and rhythm Abdomen:  Soft, nontender and nondistended. Neuro/Psych:  Alert and cooperative. Normal mood and affect. A and O x 3  Reviewed labs, radiology imaging, old records and pertinent past GI work up  Patient is appropriate for planned procedure(s) and anesthesia in an ambulatory setting   K. Scherry Ran ,  MD [phone]

## 2023-09-15 NOTE — Patient Instructions (Addendum)
 Resume previous diet.                           - Continue present medications.                           - Await pathology results.                           - Repeat colonoscopy in 5 years for surveillance                            based on pathology results. Handout on polyps given.    YOU HAD AN ENDOSCOPIC PROCEDURE TODAY AT THE [facility]:   Refer to the procedure report that was given to you for any specific questions about what was found during the examination.  If the procedure report does not answer your questions, please call your gastroenterologist to clarify.  If you requested that your care partner not be given the details of your procedure findings, then the procedure report has been included in a sealed envelope for you to review at your convenience later.  YOU SHOULD EXPECT: Some feelings of bloating in the abdomen. Passage of more gas than usual.  Walking can help get rid of the air that was put into your GI tract during the procedure and reduce the bloating. If you had a lower endoscopy (such as a colonoscopy or flexible sigmoidoscopy) you may notice spotting of blood in your stool or on the toilet paper. If you underwent a bowel prep for your procedure, you may not have a normal bowel movement for a few days.  Please Note:  You might notice some irritation and congestion in your nose or some drainage.  This is from the oxygen used during your procedure.  There is no need for concern and it should clear up in a day or so.  SYMPTOMS TO REPORT IMMEDIATELY:  Following lower endoscopy (colonoscopy or flexible sigmoidoscopy):  Excessive amounts of blood in the stool  Significant tenderness or worsening of abdominal pains  Swelling of the abdomen that is new, acute  Fever of 100F or higher  For urgent or emergent issues, a gastroenterologist can be reached at any hour by calling [phone]. Do not use MyChart messaging for urgent concerns.    DIET:  We do  recommend a small meal at first, but then you may proceed to your regular diet.  Drink plenty of fluids but you should avoid alcoholic beverages for 24 hours.  ACTIVITY:  You should plan to take it easy for the rest of today and you should NOT DRIVE or use heavy machinery until tomorrow (because of the sedation medicines used during the test).    FOLLOW UP: Our staff will call the number listed on your records the next business day following your procedure.  We will call around 7:15- 8:00 am to check on you and address any questions or concerns that you may have regarding the information given to you following your procedure. If we do not reach you, we will leave a message.     If any biopsies were taken you will be contacted by phone or by letter within the next 1-3 weeks.  Please call us at [phone] if you have not heard about the biopsies in 3 weeks.  SIGNATURES/CONFIDENTIALITY: You and/or your care partner have signed paperwork which will be entered into your electronic medical record.  These signatures attest to the fact that that the information above on your After Visit Summary has been reviewed and is understood.  Full responsibility of the confidentiality of this discharge information lies with you and/or your care-partner.

## 2023-09-15 NOTE — Progress Notes (Signed)
 Pt's states no medical or surgical changes since previsit or office visit.

## 2023-09-15 NOTE — Op Note (Signed)
 [facility] Patient Name: Tamara Colon Procedure Date: 09/15/2023 10:17 AM MRN: [id number] Endoscopist: Napoleon Form , MD, [id number] Age: 73 Referring MD:  Date of Birth: 1950/07/06 Gender: Female Account #: [id number] Procedure:                Colonoscopy Indications:              High risk colon cancer surveillance: Personal                            history of adenoma (10 mm or greater in size) Medicines:                Monitored Anesthesia Care Procedure:                Pre-Anesthesia Assessment:                           - Prior to the procedure, a History and Physical                            was performed, and patient medications and                            allergies were reviewed. The patient's tolerance of                            previous anesthesia was also reviewed. The risks                            and benefits of the procedure and the sedation                            options and risks were discussed with the patient.                            All questions were answered, and informed consent                            was obtained. Prior Anticoagulants: The patient has                            taken no anticoagulant or antiplatelet agents. ASA                            Grade Assessment: II - A patient with mild systemic                            disease. After reviewing the risks and benefits,                            the patient was deemed in satisfactory condition to                            undergo the procedure.  After obtaining informed consent, the colonoscope                            was passed under direct vision. Throughout the                            procedure, the patient's blood pressure, pulse, and                            oxygen saturations were monitored continuously. The                            [id number]-HQ190L Colonoscope [id number] was introduced                            through the anus  and advanced to the the cecum,                            identified by appendiceal orifice and ileocecal                            valve. The colonoscopy was performed without                            difficulty. The patient tolerated the procedure                            well. The quality of the bowel preparation was                            good. The ileocecal valve, appendiceal orifice, and                            rectum were photographed. Scope In: 10:25:43 AM Scope Out: 10:39:40 AM Scope Withdrawal Time: 0 hours 8 minutes 19 seconds  Total Procedure Duration: 0 hours 13 minutes 57 seconds  Findings:                 The perianal and digital rectal examinations were                            normal.                           A 3 mm polyp was found in the sigmoid colon. The                            polyp was sessile. The polyp was removed with a                            cold snare. Resection and retrieval were complete.                           Non-bleeding external and internal hemorrhoids were  found during retroflexion. The hemorrhoids were                            medium-sized. Complications:            No immediate complications. Estimated Blood Loss:     Estimated blood loss was minimal. Impression:               - One 3 mm polyp in the sigmoid colon, removed with                            a cold snare. Resected and retrieved.                           - Non-bleeding external and internal hemorrhoids. Recommendation:           - Patient has a contact number available for                            emergencies. The signs and symptoms of potential                            delayed complications were discussed with the                            patient. Return to normal activities tomorrow.                            Written discharge instructions were provided to the                            patient.                           -  Resume previous diet.                           - Continue present medications.                           - Await pathology results.                           - Repeat colonoscopy in 5 years for surveillance                            based on pathology results. Napoleon Form, MD 09/15/2023 10:46:21 AM This report has been signed electronically.

## 2023-09-15 NOTE — Progress Notes (Signed)
 To pacu, VSS. Report to [email or web address]

## 2023-09-15 NOTE — Progress Notes (Signed)
 Called to room to assist during endoscopic procedure.  Patient ID and intended procedure confirmed with present staff. Received instructions for my participation in the procedure from the performing physician.

## 2023-09-16 ENCOUNTER — Telehealth: Payer: Self-pay | Admitting: *Deleted

## 2023-09-16 NOTE — Telephone Encounter (Signed)
  Follow up Call-     09/15/2023    9:44 AM  Call back number  Post procedure Call Back phone  # [phone]  Permission to leave phone message Yes     Patient questions:  Do you have a fever, pain , or abdominal swelling? No. Pain Score  0 *  Have you tolerated food without any problems? Yes.    Have you been able to return to your normal activities? Yes.    Do you have any questions about your discharge instructions: Diet   No. Medications  No. Follow up visit  No.  Do you have questions or concerns about your Care? No.  Actions: * If pain score is 4 or above: No action needed, pain <4.

## 2023-09-17 LAB — SURGICAL PATHOLOGY

## 2023-11-04 ENCOUNTER — Ambulatory Visit: Payer: Self-pay | Admitting: Gastroenterology

## 2024-05-26 ENCOUNTER — Emergency Department (HOSPITAL_COMMUNITY)

## 2024-05-26 ENCOUNTER — Encounter (HOSPITAL_COMMUNITY): Payer: Self-pay | Admitting: Emergency Medicine

## 2024-05-26 ENCOUNTER — Emergency Department (HOSPITAL_COMMUNITY): Admission: EM | Admit: 2024-05-26 | Discharge: 2024-05-27 | Disposition: A

## 2024-05-26 ENCOUNTER — Other Ambulatory Visit: Payer: Self-pay

## 2024-05-26 DIAGNOSIS — Y93K1 Activity, walking an animal: Secondary | ICD-10-CM | POA: Insufficient documentation

## 2024-05-26 DIAGNOSIS — Z91040 Latex allergy status: Secondary | ICD-10-CM | POA: Diagnosis not present

## 2024-05-26 DIAGNOSIS — S4991XA Unspecified injury of right shoulder and upper arm, initial encounter: Secondary | ICD-10-CM | POA: Diagnosis present

## 2024-05-26 DIAGNOSIS — S42294A Other nondisplaced fracture of upper end of right humerus, initial encounter for closed fracture: Secondary | ICD-10-CM | POA: Diagnosis not present

## 2024-05-26 DIAGNOSIS — M7989 Other specified soft tissue disorders: Secondary | ICD-10-CM | POA: Diagnosis not present

## 2024-05-26 DIAGNOSIS — W19XXXA Unspecified fall, initial encounter: Secondary | ICD-10-CM | POA: Insufficient documentation

## 2024-05-26 LAB — CBC WITH DIFFERENTIAL/PLATELET
Abs Immature Granulocytes: 0.08 K/uL — ABNORMAL HIGH (ref 0.00–0.07)
Basophils Absolute: 0.1 K/uL (ref 0.0–0.1)
Basophils Relative: 0 %
Eosinophils Absolute: 0.1 K/uL (ref 0.0–0.5)
Eosinophils Relative: 1 %
HCT: 37.6 % (ref 36.0–46.0)
Hemoglobin: 12.6 g/dL (ref 12.0–15.0)
Immature Granulocytes: 1 %
Lymphocytes Relative: 11 %
Lymphs Abs: 1.5 K/uL (ref 0.7–4.0)
MCH: 30.7 pg (ref 26.0–34.0)
MCHC: 33.5 g/dL (ref 30.0–36.0)
MCV: 91.5 fL (ref 80.0–100.0)
Monocytes Absolute: 0.5 K/uL (ref 0.1–1.0)
Monocytes Relative: 4 %
Neutro Abs: 12.2 K/uL — ABNORMAL HIGH (ref 1.7–7.7)
Neutrophils Relative %: 83 %
Platelets: 295 K/uL (ref 150–400)
RBC: 4.11 MIL/uL (ref 3.87–5.11)
RDW: 12.7 % (ref 11.5–15.5)
WBC: 14.5 K/uL — ABNORMAL HIGH (ref 4.0–10.5)
nRBC: 0 % (ref 0.0–0.2)

## 2024-05-26 LAB — BASIC METABOLIC PANEL WITH GFR
Anion gap: 10 (ref 5–15)
BUN: 21 mg/dL (ref 8–23)
CO2: 27 mmol/L (ref 22–32)
Calcium: 9 mg/dL (ref 8.9–10.3)
Chloride: 102 mmol/L (ref 98–111)
Creatinine, Ser: 0.72 mg/dL (ref 0.44–1.00)
GFR, Estimated: >60 mL/min (ref >60)
Glucose, Bld: 129 mg/dL — ABNORMAL HIGH (ref 70–99)
Potassium: 4.2 mmol/L (ref 3.5–5.1)
Sodium: 139 mmol/L (ref 135–145)

## 2024-05-26 MED ORDER — FENTANYL CITRATE (PF) 50 MCG/ML IJ SOSY
50.0000 ug | PREFILLED_SYRINGE | Freq: Once | INTRAMUSCULAR | Status: AC
  Administered 2024-05-26: 50 ug via INTRAVENOUS
  Filled 2024-05-26: qty 1

## 2024-05-26 MED ORDER — ONDANSETRON HCL 4 MG/2ML IJ SOLN
4.0000 mg | Freq: Once | INTRAMUSCULAR | Status: AC
  Administered 2024-05-26: 4 mg via INTRAVENOUS
  Filled 2024-05-26: qty 2

## 2024-05-26 MED ORDER — MORPHINE SULFATE (PF) 2 MG/ML IV SOLN
2.0000 mg | Freq: Once | INTRAVENOUS | Status: DC
  Filled 2024-05-26: qty 1

## 2024-05-26 NOTE — ED Triage Notes (Signed)
 Pt BIB EMS from home after falling while walking her dogs, c/o right shoulder pain. No thinners, no loc  100mcg fentanyl   136/84 80hr 95RA

## 2024-05-26 NOTE — ED Notes (Signed)
 Patient transported to CT

## 2024-05-26 NOTE — Discharge Instructions (Addendum)
 Imaging was concerning for fracture of your humerus. I have spoken with orthopedics and they want you to follow up in office this week with Dr. Dozier. Please call the number attached with [facility] for follow up this week. I have given you a sling, please try to reduce movement as much as possible. Take tylenol  and ibuprofen as needed for pain and I will provide you with other pain management if you experience worse pain. If you experience worsening symptoms including loss of felling in your hand, discoloration of the hand or arm, or chest pain/shortness of breath please return to ED for further evaluation.

## 2024-05-26 NOTE — ED Provider Notes (Signed)
 [place] EMERGENCY DEPARTMENT AT [facility] Provider Note   CSN: [id number] Arrival date & time: 05/26/24  8094     Patient presents with: Tamara Colon is a 72 y.o. female.  73 year old female presents to ED with complaints of mechanical fall while walking her dog.  Patient reports she was walking her dog when he began pulling her and she was running because he was pulling so hard when she tripped and landed on her right shoulder.  Patient is not on blood thinners and denies headache, visual disturbances, neck pain, loss of consciousness, or head injury.  Patient reports her right shoulder and right humerus is in significant pain and she was unable to get up due to the pain.  EMS was called and they put the patient in a sling.  Vitals are unremarkable prior to arrival and she received 100 mcg of fentanyl .  Patient denies any significant medical history.  Only reported injury is right shoulder.     Prior to Admission medications   Medication Sig Start Date End Date Taking? Authorizing Provider  HYDROcodone-acetaminophen  (NORCO/VICODIN) 5-325 MG tablet Take 1 tablet by mouth every 4 (four) hours as needed. 05/27/24  Yes Robinson, John K, PA-C  ondansetron  (ZOFRAN ) 4 MG tablet Take 1 tablet (4 mg total) by mouth every 6 (six) hours. 05/27/24  Yes Myriam Fonda RAMAN, PA-C  Bioflavonoid Products (ESTER C PO) Take 250 mg by mouth daily.    [provider]  calcium citrate-vitamin D (CITRACAL+D) 315-200 MG-UNIT tablet Take by mouth.    [provider]  Cholecalciferol (D3 5000 PO) Take by mouth. In winter 5 day a week    [provider]  ibuprofen (ADVIL,MOTRIN) 200 MG tablet Take 400 mg by mouth every 6 (six) hours as needed for mild pain.     [provider]  Lactobacillus (PROBIOTIC ACIDOPHILUS PO) Take by mouth. 25 billion    [provider]  Menaquinone-7 (VITAMIN K2 PO) Take by mouth daily. 150 mcg 5 days a week with D3 in winter     [provider]  Misc Natural Products (ELDERBERRY ZINC/VIT C/IMMUNE MT) Use as directed in the mouth or throat.    [provider]  Misc Natural Products (TOTAL MEMORY & FOCUS FORMULA PO) Take 1 capsule by mouth daily at 8 pm. Co q 10,  100 mg, nutri p qq, 10 mg, resveratrol 100 mg , quercetin 100 mg, cititoline 100 mg, biotin 12.5 mg    [provider]  Multiple Vitamins-Minerals (MULTIVITAMIN WITH MINERALS) tablet Take 1 tablet by mouth daily.    [provider]  Omega-3 Fatty Acids (FISH OIL) 1000 MG CAPS Take by mouth.    [provider]  tamsulosin (FLOMAX) 0.4 MG CAPS capsule Take 0.4 mg by mouth daily.    [provider]  Ubiquinol 100 MG CAPS Take by mouth daily.    [provider]  Zinc Citrate 16.67 MG CHEW Chew by mouth.    [provider]    Allergies: Fire ant, Latex, Other, Sulfa antibiotics, and Tape    Review of Systems  Musculoskeletal:  Positive for arthralgias and myalgias.  Skin:  Positive for wound.  All other systems reviewed and are negative.   Updated Vital Signs BP 127/69   Pulse (!) 107   Temp 98 F (36.7 C)   Resp 16   Ht 5' 4 (1.626 m)   Wt 77.1 kg   SpO2 99%   BMI  29.18 kg/m   Physical Exam Vitals and nursing note reviewed.  Constitutional:      Appearance: Normal appearance.  HENT:     Head: Normocephalic and atraumatic.     Nose: Nose normal.  Eyes:     Extraocular Movements: Extraocular movements intact.     Conjunctiva/sclera: Conjunctivae normal.     Pupils: Pupils are equal, round, and reactive to light.     Comments: Pupils are equal and reactive bilaterally with no pain to EOM.  Neck:     Comments: Patient has no difficulty with neck range of motion and denies any pain to palpation of the C-spine. Cardiovascular:     Rate and Rhythm: Normal rate.  Pulmonary:     Effort: Pulmonary effort is normal. No respiratory distress.     Breath sounds: Normal breath  sounds.  Abdominal:     General: Abdomen is flat.     Tenderness: There is no abdominal tenderness. There is no guarding.  Musculoskeletal:        General: Swelling, tenderness and signs of injury present.     Cervical back: Normal range of motion.     Comments: Patient has good cap refill and strong pulses in bilateral upper extremities.  Patient has full sensation bilaterally and can move wrist and fingers appropriately.  Right shoulder appears dislocated with some step-off no pain to palpation of bilateral clavicles.  Patient denies any pain to elbow but does endorse pain in the proximal humerus.  Patient has full sensation throughout right shoulder girdle.  Skin:    General: Skin is warm.     Capillary Refill: Capillary refill takes less than 2 seconds.  Neurological:     General: No focal deficit present.     Mental Status: She is alert.     Comments: Patient denies any weakness and no noted neurological deficits.  Right arm is immobilized with sling and unable to perform strength testing.  Patient has full sensation in the distal extremity.  Psychiatric:        Mood and Affect: Mood normal.        Behavior: Behavior normal.     (all labs ordered are listed, but only abnormal results are displayed) Labs Reviewed  CBC WITH DIFFERENTIAL/PLATELET - Abnormal; Notable for the following components:      Result Value   WBC 14.5 (*)    Neutro Abs 12.2 (*)    Abs Immature Granulocytes 0.08 (*)    All other components within normal limits  BASIC METABOLIC PANEL WITH GFR - Abnormal; Notable for the following components:   Glucose, Bld 129 (*)    All other components within normal limits    EKG: None  Radiology: DG Ribs Unilateral W/Chest Right Result Date: 05/26/2024 CLINICAL DATA:  Fall EXAM: RIGHT RIBS AND CHEST - 3+ VIEW COMPARISON:  None Available. FINDINGS: No fracture or other bone lesions are seen involving the ribs. There is no evidence of pneumothorax or pleural  effusion. Both lungs are clear. Heart size and mediastinal contours are within normal limits. IMPRESSION: Negative. Electronically Signed   By: Greig Pique M.D.   On: 05/26/2024 23:44   CT Shoulder Right Wo Contrast Result Date: 05/26/2024 CLINICAL DATA:  Trauma EXAM: CT OF THE UPPER RIGHT EXTREMITY WITHOUT CONTRAST TECHNIQUE: Multidetector CT imaging of the upper right extremity was performed according to the standard protocol. RADIATION DOSE REDUCTION: This exam was performed according to the departmental dose-optimization program which includes automated exposure control, adjustment of the  mA and/or kV according to patient size and/or use of iterative reconstruction technique. COMPARISON:  X-ray same day FINDINGS: Bones/Joint/Cartilage There is a comminuted fracture involving the neck of the humerus and head of the humerus. The fracture is impacted. There is 1/2 shaft width anterior displacement of the distal fracture fragment with 2.5 cm of overlap. There is no dislocation. Lipohemarthrosis present. Ligaments Suboptimally assessed by CT. Muscles and Tendons There is intramuscular edema surrounding the fracture. Soft tissues There is a small right pleural effusion with atelectasis in the right lower lobe. IMPRESSION: 1. Comminuted, impacted and displaced fracture of the neck and head of the humerus. 2. Lipohemarthrosis. 3. Small right pleural effusion with atelectasis in the right lower lobe. Electronically Signed   By: Greig Pique M.D.   On: 05/26/2024 22:39   DG Shoulder Right Result Date: 05/26/2024 CLINICAL DATA:  Fall EXAM: DG SHOULDER 2+V*R* COMPARISON:  None Available. FINDINGS: There an acute fracture through the neck of the humerus. This appears impacted. There is 15 mm of posterior offset of the humeral in relation to the humeral diaphysis. There is no dislocation IMPRESSION: Acute fracture through the neck of the humerus. Electronically Signed   By: Greig Pique M.D.   On: 05/26/2024 21:37    DG Humerus Right Result Date: 05/26/2024 EXAM: 1 VIEW(S) XRAY OF THE UNSPECIFIED HUMERUS 05/26/2024 08:39:00 PM COMPARISON: None available. CLINICAL HISTORY: fall FINDINGS: BONES AND JOINTS: Fracture of the humeral head with fracture planes involving the greater tuberosity, surgical neck, and possible lesser tuberosity. Pseudosubluxation of the humeral head inferiorly without frank dislocation. Medial and anterior displacement of the humeral shaft in relation to the humeral head fracture fragments. The distal humerus appears intact. SOFT TISSUES: The soft tissues are unremarkable. IMPRESSION: 1. Fracture of the humeral head with fracture planes involving the greater tuberosity, surgical neck, and possible lesser tuberosity. CT imaging would be helpful for further evaluation. 2. Inferior pseudosubluxation of the humeral head without frank dislocation. 3. Medial and anterior displacement of the humeral shaft relative to the humeral head fracture fragments. Electronically signed by: Dorethia Molt MD 05/26/2024 09:28 PM EST RP Workstation: [id number]    Procedures   Medications Ordered in the ED  ondansetron  (ZOFRAN ) injection 4 mg (4 mg Intravenous Given 05/26/24 1953)  fentaNYL  (SUBLIMAZE ) injection 50 mcg (50 mcg Intravenous Given 05/26/24 1957)  fentaNYL  (SUBLIMAZE ) injection 50 mcg (50 mcg Intravenous Given 05/26/24 2245)  HYDROcodone -acetaminophen  (NORCO/VICODIN) 5-325 MG per tablet 1 tablet (1 tablet Oral Given 05/27/24 0016)    73 y.o. female presents to the ED with complaints of right shoulder and right proximal humerus pain following mechanical fall while walking her dog the differential diagnosis includes shoulder dislocation, fracture, musculoskeletal injury, arterial, nerve injury (Ddx)  On arrival pt is nontoxic, vitals patient is tachypneic with no other concerning vitals. Exam significant for step-off on the right shoulder with significant pain to the right shoulder and proximal  humerus  I ordered medication Fentanyl  and Zofran  for pain and patient requested nausea medication with pain medicine.  Lab Tests:  I Ordered, reviewed, and interpreted labs, which included: CBC and BMP.  CBC remarkable for leukocytosis.  Patient denies any recent illness or fever.  No reported urinary symptoms or concern for recent illness.  Imaging Studies ordered:  I ordered imaging studies which included right shoulder and right humerus x-ray.  Significant for proximal humeral fracture.  After discussion with Ortho CT shoulder was ordered.  CT of shoulder resulted and no dislocation but positive for  proximal humeral fracture.  Incidental finding of pleural effusion with atelectasis.  At this time chest x-ray and right rib x-ray were also ordered.  X-ray of chest was unremarkable for any rib fractures pneumothorax or pleural effusion.  ED Course:   On initial evaluation patient is in no acute distress and nontoxic-appearing.  Patient is concerned for shoulder fracture or dislocation.  Patient landed on her shoulder while running with her dog following a mechanical fall.  No obvious lacerations on initial evaluation.  Pending x-rays for evaluation.  Morphine initially ordered for patient and family requested alternative and patient agreed to fentanyl  because she received fentanyl  prior to arrival with EMS with resolution of pain.  Patient requesting Zofran  but she is afraid she will become nauseous with pain medicine.  X-ray resulted in fracture of the humeral head, medial and anterior displacement of the humeral head fragments.   Dr. Sherida was consulted and advised to continue with CT of the shoulder and have shoulder placed in sling with outpatient follow-up this week with Dr. Dozier.  Pending results of CT scan.   After CT results chest x-ray was ordered for further evaluation which resulted in negative for pneumothorax, rib fracture, pleural effusion.  Plan was discussed with patient  and family at bedside they are requesting short course of hydrocodone with Zofran .  Patient was advised to take Tylenol  and ibuprofen with hydrocodone only as needed.  Patient was given information to follow-up with Dr. Jetta office with instructions to see Dr. Dozier.  Patient agreed to treatment plan and was comfortable discharge at this time.  Family is driving patient, she was advised on strict return precautions and concerning symptoms to monitor for.   Portions of this note were generated with Scientist, clinical (histocompatibility and immunogenetics). Dictation errors may occur despite best attempts at proofreading.   Final diagnoses:  Other closed nondisplaced fracture of proximal end of right humerus, initial encounter    ED Discharge Orders          Ordered    HYDROcodone-acetaminophen  (NORCO/VICODIN) 5-325 MG tablet  Every 4 hours PRN        05/27/24 0011    ondansetron  (ZOFRAN ) 4 MG tablet  Every 6 hours        05/27/24 0011               Myriam Fonda RAMAN, PA-C 05/27/24 0059    Neysa Caron PARAS, DO 05/27/24 1455

## 2024-05-27 DIAGNOSIS — S42294A Other nondisplaced fracture of upper end of right humerus, initial encounter for closed fracture: Secondary | ICD-10-CM | POA: Diagnosis not present

## 2024-05-27 MED ORDER — ONDANSETRON HCL 4 MG PO TABS: 4.0000 mg | ORAL_TABLET | Freq: Four times a day (QID) | ORAL | 0 refills | Status: AC

## 2024-05-27 MED ORDER — HYDROCODONE-ACETAMINOPHEN 5-325 MG PO TABS: 1.0000 | ORAL_TABLET | ORAL | 0 refills | Status: AC | PRN

## 2024-05-27 MED ORDER — HYDROCODONE-ACETAMINOPHEN 5-325 MG PO TABS
1.0000 | ORAL_TABLET | Freq: Once | ORAL | Status: AC
  Administered 2024-05-27: 1 via ORAL
  Filled 2024-05-27: qty 1

## 2024-05-27 NOTE — Progress Notes (Signed)
 Orthopedic Tech Progress Note Patient Details:  Angelita Harnack 1950/08/04 [id number] Patient's family would like to wait 15-20 mins for pain meds to activate due to severity of pain as she could not tolerate me manipulating the arm.   Patient ID: Jezel Basto, female   DOB: April 28, 1951, 73 y.o.   MRN: [id number]  Giovanni LITTIE Lukes 05/27/2024, 12:19 AM

## 2024-05-27 NOTE — ED Notes (Signed)
Ortho Tech at bedside.  

## 2024-05-27 NOTE — Progress Notes (Signed)
 Orthopedic Tech Progress Note Patient Details:  Tamara Colon January 08, 1951 [id number]  Ortho Devices Type of Ortho Device: Arm sling Ortho Device/Splint Location: R HUMERUS Ortho Device/Splint Interventions: Ordered, Application, Adjustment   Post Interventions Patient Tolerated: Poor, Fair Instructions Provided: Adjustment of device  Kaysi Ourada L Tanna Loeffler 05/27/2024, 1:11 AM
